# Patient Record
Sex: Female | Born: 1955
Health system: Southern US, Community
[De-identification: ages and names within clinical notes are randomized; demographics above are authoritative.]

## PROBLEM LIST (undated history)

## (undated) DIAGNOSIS — K219 Gastro-esophageal reflux disease without esophagitis: Secondary | ICD-10-CM

## (undated) DIAGNOSIS — Z8719 Personal history of other diseases of the digestive system: Secondary | ICD-10-CM

## (undated) DIAGNOSIS — E7800 Pure hypercholesterolemia, unspecified: Secondary | ICD-10-CM

## (undated) DIAGNOSIS — M1990 Unspecified osteoarthritis, unspecified site: Secondary | ICD-10-CM

## (undated) DIAGNOSIS — I10 Essential (primary) hypertension: Secondary | ICD-10-CM

## (undated) HISTORY — DX: Pure hypercholesterolemia, unspecified: E78.00

## (undated) HISTORY — DX: Gastro-esophageal reflux disease without esophagitis: K21.9

## (undated) HISTORY — PX: CARPAL TUNNEL RELEASE: SHX101

## (undated) HISTORY — PX: TUBAL LIGATION: SHX77

---

## 2013-08-05 NOTE — H&P (Signed)
TOTAL HIP ADMISSION H&P  Patient is admitted for left total hip arthroplasty.  Subjective:  Chief Complaint: Left hip OA / pain  HPI: Sharon Chung, 58 y.o. female, has a history of pain and functional disability in the left hip(s) due to trauma and patient has failed non-surgical conservative treatments for greater than 12 weeks to include NSAID's and/or analgesics, corticosteriod injections and activity modification.  Onset of symptoms was abrupt starting 3+ years ago with gradually worsening course since that time.The patient noted no past surgery on the left hip(s).  Patient currently rates pain in the left hip at 10 out of 10 with activity. Patient has night pain, worsening of pain with activity and weight bearing, trendelenberg gait, pain that interfers with activities of daily living and pain with passive range of motion. Patient has evidence of periarticular osteophytes and joint space narrowing by imaging studies. This condition presents safety issues increasing the risk of falls. There is no current active infection.  Risks, benefits and expectations were discussed with the patient.  Risks including but not limited to the risk of anesthesia, blood clots, nerve damage, blood vessel damage, failure of the prosthesis, infection and up to and including death.  Patient understand the risks, benefits and expectations and wishes to proceed with surgery.   PCP: Toma DeitersHASANAJ,XAJE A, MD  D/C Plans:      Home with HHPT  Post-op Meds:       No Rx given  Tranexamic Acid:      To be given - IV    Decadron:      Is to be given  FYI:     ASA post-op  Norco post-op    Past Medical History  Diagnosis Date  . Arthritis   . Hypertension   . H/O hiatal hernia      Past Surgical History  Procedure Laterality Date  . Tubal ligation     No Known Allergies   History  Substance Use Topics  . Smoking status: Never Smoker   . Smokeless tobacco: Never Used  . Alcohol Use: No       Review of  Systems  Constitutional: Negative.   HENT: Negative.   Eyes: Negative.   Respiratory: Negative.   Cardiovascular: Negative.   Gastrointestinal: Negative.   Genitourinary: Negative.   Musculoskeletal: Positive for back pain, joint pain and myalgias.  Skin: Negative.   Neurological: Negative.   Endo/Heme/Allergies: Negative.   Psychiatric/Behavioral: Negative.     Objective:  Physical Exam  Constitutional: She is oriented to person, place, and time. She appears well-developed and well-nourished.  HENT:  Head: Normocephalic and atraumatic.  Eyes: Pupils are equal, round, and reactive to light.  Neck: Neck supple. No JVD present. No tracheal deviation present. No thyromegaly present.  Cardiovascular: Normal rate, regular rhythm, normal heart sounds and intact distal pulses.   Respiratory: Effort normal and breath sounds normal. No respiratory distress. She has no wheezes.  GI: Soft. There is no tenderness. There is no guarding.  Musculoskeletal:       Left hip: She exhibits decreased range of motion, decreased strength, tenderness and bony tenderness. She exhibits no swelling, no deformity and no laceration.  Lymphadenopathy:    She has no cervical adenopathy.  Neurological: She is alert and oriented to person, place, and time.  Skin: Skin is warm and dry.  Psychiatric: She has a normal mood and affect.      Imaging Review Plain radiographs demonstrate severe degenerative joint disease of the left hip(s).  The bone quality appears to be good for age and reported activity level.  Assessment/Plan:  End stage arthritis, left hip(s)  The patient history, physical examination, clinical judgement of the provider and imaging studies are consistent with end stage degenerative joint disease of the left hip(s) and total hip arthroplasty is deemed medically necessary. The treatment options including medical management, injection therapy, arthroscopy and arthroplasty were discussed at  length. The risks and benefits of total hip arthroplasty were presented and reviewed. The risks due to aseptic loosening, infection, stiffness, dislocation/subluxation,  thromboembolic complications and other imponderables were discussed.  The patient acknowledged the explanation, agreed to proceed with the plan and consent was signed. Patient is being admitted for inpatient treatment for surgery, pain control, PT, OT, prophylactic antibiotics, VTE prophylaxis, progressive ambulation and ADL's and discharge planning.The patient is planning to be discharged home with home health services.    Anastasio Auerbach Suan Pyeatt   PA-C  08/05/2013, 11:23 AM

## 2013-08-06 ENCOUNTER — Encounter (HOSPITAL_COMMUNITY): Payer: Self-pay | Admitting: Pharmacy Technician

## 2013-08-11 ENCOUNTER — Other Ambulatory Visit (HOSPITAL_COMMUNITY): Payer: Self-pay | Admitting: Orthopedic Surgery

## 2013-08-11 NOTE — Patient Instructions (Addendum)
Your procedure is scheduled on:  08/18/13   TUESDAY  [facility]- FOLLOW SIGNS TO SHORT STAY CENTER Short Stay Center at 0600      AM.   Call this number if you have problems the morning of surgery: [phone]        Do not eat food  Or drink :After Midnight .MONDAY NIGHT   Take these medicines the morning of surgery with A SIP OF WATER:AMLODIPINE May take TRAMADOL IF NEEDED   .  Contacts, dentures or partial plates, or metal hairpins  can not be worn to surgery. Your family will be responsible for glasses, dentures, hearing aides while you are in surgery  Leave suitcase in the car. After surgery it may be brought to your room.  For patients admitted to the hospital, checkout time is 11:00 AM day of  discharge.         [facility] IS NOT RESPONSIBLE FOR ANY VALUABLES                                                                                                                                       [facility] - Preparing for Surgery Before surgery, you can play an important role.  Because skin is not sterile, your skin needs to be as free of germs as possible.  You can reduce the number of germs on your skin by washing with CHG (chlorahexidine gluconate) soap before surgery.  CHG is an antiseptic cleaner which kills germs and bonds with the skin to continue killing germs even after washing. Please DO NOT use if you have an allergy to CHG or antibacterial soaps.  If your skin becomes reddened/irritated stop using the CHG and inform your nurse when you arrive at Short Stay. Do not shave (including legs and underarms) for at least 48 hours prior to the first CHG shower.  You may shave your face/neck. Please follow these instructions carefully:  1.  Shower with CHG Soap the night before surgery and the  morning of Surgery.  2.  If you choose to wash your hair, wash your hair first as usual with your  normal  shampoo.  3.  After you shampoo, rinse your  hair and body thoroughly to remove the  shampoo.                           4.  Use CHG as you would any other liquid soap.  You can apply chg directly  to the skin and wash                       Gently with a scrungie or clean washcloth.  5.  Apply the CHG Soap to your body ONLY FROM THE NECK DOWN.   Do not use on face/ open  Wound or open sores. Avoid contact with eyes, ears mouth and genitals (private parts).                       Wash face,  Genitals (private parts) with your normal soap.             6.  Wash thoroughly, paying special attention to the area where your surgery  will be performed.  7.  Thoroughly rinse your body with warm water from the neck down.  8.  DO NOT shower/wash with your normal soap after using and rinsing off  the CHG Soap.                9.  Pat yourself dry with a clean towel.            10.  Wear clean pajamas.            11.  Place clean sheets on your bed the night of your first shower and do not  sleep with pets. Day of Surgery : Do not apply any lotions/deodorants the morning of surgery.  Please wear clean clothes to the hospital/surgery center.  FAILURE TO FOLLOW THESE INSTRUCTIONS MAY RESULT IN THE CANCELLATION OF YOUR SURGERY PATIENT SIGNATURE_________________________________  NURSE SIGNATURE__________________________________  ________________________________________________________________________  WHAT IS A BLOOD TRANSFUSION? Blood Transfusion Information  A transfusion is the replacement of blood or some of its parts. Blood is made up of multiple cells which provide different functions.  Red blood cells carry oxygen and are used for blood loss replacement.  White blood cells fight against infection.  Platelets control bleeding.  Plasma helps clot blood.  Other blood products are available for specialized needs, such as hemophilia or other clotting disorders. BEFORE THE TRANSFUSION  Who gives blood for transfusions?    Healthy volunteers who are fully evaluated to make sure their blood is safe. This is blood bank blood. Transfusion therapy is the safest it has ever been in the practice of medicine. Before blood is taken from a donor, a complete history is taken to make sure that person has no history of diseases nor engages in risky social behavior (examples are intravenous drug use or sexual activity with multiple partners). The donor's travel history is screened to minimize risk of transmitting infections, such as malaria. The donated blood is tested for signs of infectious diseases, such as HIV and hepatitis. The blood is then tested to be sure it is compatible with you in order to minimize the chance of a transfusion reaction. If you or a relative donates blood, this is often done in anticipation of surgery and is not appropriate for emergency situations. It takes many days to process the donated blood. RISKS AND COMPLICATIONS Although transfusion therapy is very safe and saves many lives, the main dangers of transfusion include:   Getting an infectious disease.  Developing a transfusion reaction. This is an allergic reaction to something in the blood you were given. Every precaution is taken to prevent this. The decision to have a blood transfusion has been considered carefully by your caregiver before blood is given. Blood is not given unless the benefits outweigh the risks. AFTER THE TRANSFUSION  Right after receiving a blood transfusion, you will usually feel much better and more energetic. This is especially true if your red blood cells have gotten low (anemic). The transfusion raises the level of the red blood cells which carry oxygen, and this usually causes an energy increase.  The  nurse administering the transfusion will monitor you carefully for complications. HOME CARE INSTRUCTIONS  No special instructions are needed after a transfusion. You may find your energy is better. Speak with your  caregiver about any limitations on activity for underlying diseases you may have. SEEK MEDICAL CARE IF:   Your condition is not improving after your transfusion.  You develop redness or irritation at the intravenous (IV) site. SEEK IMMEDIATE MEDICAL CARE IF:  Any of the following symptoms occur over the next 12 hours:  Shaking chills.  You have a temperature by mouth above 102 F (38.9 C), not controlled by medicine.  Chest, back, or muscle pain.  People around you feel you are not acting correctly or are confused.  Shortness of breath or difficulty breathing.  Dizziness and fainting.  You get a rash or develop hives.  You have a decrease in urine output.  Your urine turns a dark color or changes to pink, red, or brown. Any of the following symptoms occur over the next 10 days:  You have a temperature by mouth above 102 F (38.9 C), not controlled by medicine.  Shortness of breath.  Weakness after normal activity.  The white part of the eye turns yellow (jaundice).  You have a decrease in the amount of urine or are urinating less often.  Your urine turns a dark color or changes to pink, red, or brown. Document Released: 12/23/1999 Document Revised: 03/19/2011 Document Reviewed: 08/11/2007 [facility] Patient Information 2014 [place], [place].  _______________________________________________________________________

## 2013-08-12 ENCOUNTER — Ambulatory Visit (HOSPITAL_COMMUNITY)
Admission: RE | Admit: 2013-08-12 | Discharge: 2013-08-12 | Disposition: A | Discharge status: Home / Self Care | Payer: BC Managed Care – PPO | Source: Ambulatory Visit | Attending: Anesthesiology | Admitting: Anesthesiology

## 2013-08-12 ENCOUNTER — Encounter (HOSPITAL_COMMUNITY)
Admission: RE | Admit: 2013-08-12 | Discharge: 2013-08-12 | Disposition: A | Discharge status: Home / Self Care | Payer: BC Managed Care – PPO | Source: Ambulatory Visit | Attending: Orthopedic Surgery | Admitting: Orthopedic Surgery

## 2013-08-12 ENCOUNTER — Encounter (HOSPITAL_COMMUNITY): Payer: Self-pay

## 2013-08-12 DIAGNOSIS — Z01812 Encounter for preprocedural laboratory examination: Secondary | ICD-10-CM | POA: Insufficient documentation

## 2013-08-12 DIAGNOSIS — I10 Essential (primary) hypertension: Secondary | ICD-10-CM | POA: Diagnosis not present

## 2013-08-12 DIAGNOSIS — Z01818 Encounter for other preprocedural examination: Secondary | ICD-10-CM | POA: Insufficient documentation

## 2013-08-12 DIAGNOSIS — Z01810 Encounter for preprocedural cardiovascular examination: Secondary | ICD-10-CM | POA: Diagnosis not present

## 2013-08-12 HISTORY — DX: Unspecified osteoarthritis, unspecified site: M19.90

## 2013-08-12 HISTORY — DX: Personal history of other diseases of the digestive system: Z87.19

## 2013-08-12 HISTORY — DX: Essential (primary) hypertension: I10

## 2013-08-12 LAB — URINALYSIS, ROUTINE W REFLEX MICROSCOPIC
Bilirubin Urine: NEGATIVE
Glucose, UA: NEGATIVE mg/dL
Hgb urine dipstick: NEGATIVE
Ketones, ur: NEGATIVE mg/dL
Nitrite: NEGATIVE
Protein, ur: NEGATIVE mg/dL
Specific Gravity, Urine: 1.018 (ref 1.005–1.030)
Urobilinogen, UA: 1 mg/dL (ref 0.0–1.0)
pH: 7 (ref 5.0–8.0)

## 2013-08-12 LAB — BASIC METABOLIC PANEL
Anion gap: 13 (ref 5–15)
BUN: 17 mg/dL (ref 6–23)
CO2: 25 mEq/L (ref 19–32)
Calcium: 9.3 mg/dL (ref 8.4–10.5)
Chloride: 102 mEq/L (ref 96–112)
Creatinine, Ser: 0.67 mg/dL (ref 0.50–1.10)
GFR calc Af Amer: >90 mL/min (ref >90)
GFR calc non Af Amer: >90 mL/min (ref >90)
Glucose, Bld: 89 mg/dL (ref 70–99)
Potassium: 4.4 mEq/L (ref 3.7–5.3)
Sodium: 140 mEq/L (ref 137–147)

## 2013-08-12 LAB — APTT: aPTT: 29 seconds (ref 24–37)

## 2013-08-12 LAB — PROTIME-INR
INR: 0.99 (ref 0.00–1.49)
Prothrombin Time: 13.1 seconds (ref 11.6–15.2)

## 2013-08-12 LAB — SURGICAL PCR SCREEN
MRSA, PCR: NEGATIVE
Staphylococcus aureus: POSITIVE — AB

## 2013-08-12 LAB — CBC
HCT: 37.9 % (ref 36.0–46.0)
Hemoglobin: 13.1 g/dL (ref 12.0–15.0)
MCH: 28.9 pg (ref 26.0–34.0)
MCHC: 34.6 g/dL (ref 30.0–36.0)
MCV: 83.5 fL (ref 78.0–100.0)
Platelets: 204 10*3/uL (ref 150–400)
RBC: 4.54 MIL/uL (ref 3.87–5.11)
RDW: 12.6 % (ref 11.5–15.5)
WBC: 4.4 10*3/uL (ref 4.0–10.5)

## 2013-08-12 LAB — URINE MICROSCOPIC-ADD ON

## 2013-08-12 LAB — ABO/RH: ABO/RH(D): A POS

## 2013-08-12 NOTE — Progress Notes (Signed)
08/12/13 0906  OBSTRUCTIVE SLEEP APNEA  Have you ever been diagnosed with sleep apnea through a sleep study? No  Do you snore loudly (loud enough to be heard through closed doors)?  1  Do you often feel tired, fatigued, or sleepy during the daytime? 1  Has anyone observed you stop breathing during your sleep? 0  Do you have, or are you being treated for high blood pressure? 1  BMI more than 35 kg/m2? 1  Age over 60 years old? 1  Neck circumference greater than 40 cm/16 inches? 0  Gender: 0  Obstructive Sleep Apnea Score 5

## 2013-08-12 NOTE — Progress Notes (Signed)
Faxed urine with micro to Dr Olin via EPIC 

## 2013-08-12 NOTE — Progress Notes (Signed)
08/12/13 0906  OBSTRUCTIVE SLEEP APNEA  Have you ever been diagnosed with sleep apnea through a sleep study? No  Do you snore loudly (loud enough to be heard through closed doors)?  1  Has anyone observed you stop breathing during your sleep? 1  Do you have, or are you being treated for high blood pressure? 1  BMI more than 35 kg/m2? 1  Age over 60 years old? 1  Neck circumference greater than 40 cm/16 inches? 0  Gender: 0  Obstructive Sleep Apnea Score 5  Score 4 or greater  Results sent to PCP

## 2013-08-18 ENCOUNTER — Encounter (HOSPITAL_COMMUNITY)
Admission: RE | Disposition: A | Discharge status: Home Health | Payer: Self-pay | Source: Ambulatory Visit | Attending: Orthopedic Surgery

## 2013-08-18 ENCOUNTER — Inpatient Hospital Stay (HOSPITAL_COMMUNITY): Payer: BC Managed Care – PPO

## 2013-08-18 ENCOUNTER — Inpatient Hospital Stay (HOSPITAL_COMMUNITY): Payer: BC Managed Care – PPO | Admitting: Anesthesiology

## 2013-08-18 ENCOUNTER — Inpatient Hospital Stay (HOSPITAL_COMMUNITY)
Admission: RE | Admit: 2013-08-18 | Discharge: 2013-08-19 | DRG: 470 | Disposition: A | Discharge status: Home Health | Payer: BC Managed Care – PPO | Source: Ambulatory Visit | Attending: Orthopedic Surgery | Admitting: Orthopedic Surgery

## 2013-08-18 ENCOUNTER — Encounter (HOSPITAL_COMMUNITY): Payer: Self-pay | Admitting: *Deleted

## 2013-08-18 ENCOUNTER — Encounter (HOSPITAL_COMMUNITY): Payer: BC Managed Care – PPO | Admitting: Anesthesiology

## 2013-08-18 DIAGNOSIS — E669 Obesity, unspecified: Secondary | ICD-10-CM | POA: Diagnosis present

## 2013-08-18 DIAGNOSIS — M1610 Unilateral primary osteoarthritis, unspecified hip: Secondary | ICD-10-CM | POA: Diagnosis present

## 2013-08-18 DIAGNOSIS — I10 Essential (primary) hypertension: Secondary | ICD-10-CM | POA: Diagnosis present

## 2013-08-18 DIAGNOSIS — Z6839 Body mass index (BMI) 39.0-39.9, adult: Secondary | ICD-10-CM | POA: Diagnosis not present

## 2013-08-18 DIAGNOSIS — K449 Diaphragmatic hernia without obstruction or gangrene: Secondary | ICD-10-CM | POA: Diagnosis present

## 2013-08-18 DIAGNOSIS — Z96642 Presence of left artificial hip joint: Secondary | ICD-10-CM

## 2013-08-18 DIAGNOSIS — D62 Acute posthemorrhagic anemia: Secondary | ICD-10-CM | POA: Diagnosis not present

## 2013-08-18 DIAGNOSIS — M169 Osteoarthritis of hip, unspecified: Principal | ICD-10-CM | POA: Diagnosis present

## 2013-08-18 DIAGNOSIS — M25559 Pain in unspecified hip: Secondary | ICD-10-CM | POA: Diagnosis present

## 2013-08-18 HISTORY — PX: TOTAL HIP ARTHROPLASTY: SHX124

## 2013-08-18 LAB — TYPE AND SCREEN
ABO/RH(D): A POS
Antibody Screen: NEGATIVE

## 2013-08-18 SURGERY — ARTHROPLASTY, HIP, TOTAL, ANTERIOR APPROACH
Anesthesia: Spinal | Site: Hip | Laterality: Left

## 2013-08-18 MED ORDER — METHOCARBAMOL 500 MG PO TABS
500.0000 mg | ORAL_TABLET | Freq: Four times a day (QID) | ORAL | Status: DC | PRN
  Administered 2013-08-18 – 2013-08-19 (×2): 500 mg via ORAL
  Filled 2013-08-18 (×2): qty 1

## 2013-08-18 MED ORDER — ACETAMINOPHEN 500 MG PO TABS
1000.0000 mg | ORAL_TABLET | Freq: Four times a day (QID) | ORAL | Status: AC
  Administered 2013-08-18 (×2): 1000 mg via ORAL
  Filled 2013-08-18 (×2): qty 2

## 2013-08-18 MED ORDER — SODIUM CHLORIDE 0.9 % IV SOLN
1000.0000 mg | Freq: Once | INTRAVENOUS | Status: AC
  Administered 2013-08-18: 1000 mg via INTRAVENOUS
  Filled 2013-08-18: qty 10

## 2013-08-18 MED ORDER — DEXAMETHASONE SODIUM PHOSPHATE 10 MG/ML IJ SOLN
10.0000 mg | Freq: Once | INTRAMUSCULAR | Status: AC
  Administered 2013-08-18: 10 mg via INTRAVENOUS

## 2013-08-18 MED ORDER — AMLODIPINE BESYLATE 5 MG PO TABS
5.0000 mg | ORAL_TABLET | Freq: Every morning | ORAL | Status: DC
  Administered 2013-08-19: 5 mg via ORAL
  Filled 2013-08-18: qty 1

## 2013-08-18 MED ORDER — PROPOFOL 10 MG/ML IV BOLUS: INTRAVENOUS | Status: DC | PRN

## 2013-08-18 MED ORDER — CEFAZOLIN SODIUM-DEXTROSE 2-3 GM-% IV SOLR
INTRAVENOUS | Status: AC
  Filled 2013-08-18: qty 50

## 2013-08-18 MED ORDER — MIDAZOLAM HCL 2 MG/2ML IJ SOLN
INTRAMUSCULAR | Status: AC
  Filled 2013-08-18: qty 2

## 2013-08-18 MED ORDER — PHENYLEPHRINE HCL 10 MG/ML IJ SOLN
INTRAMUSCULAR | Status: DC | PRN
  Administered 2013-08-18 (×4): 40 ug via INTRAVENOUS
  Administered 2013-08-18: 80 ug via INTRAVENOUS
  Administered 2013-08-18: 40 ug via INTRAVENOUS

## 2013-08-18 MED ORDER — MIDAZOLAM HCL 5 MG/5ML IJ SOLN
INTRAMUSCULAR | Status: DC | PRN
  Administered 2013-08-18: 2 mg via INTRAVENOUS

## 2013-08-18 MED ORDER — HYDROMORPHONE HCL PF 1 MG/ML IJ SOLN: 0.2500 mg | INTRAMUSCULAR | Status: DC | PRN

## 2013-08-18 MED ORDER — CEFAZOLIN SODIUM-DEXTROSE 2-3 GM-% IV SOLR
2.0000 g | INTRAVENOUS | Status: AC
  Administered 2013-08-18: 2 g via INTRAVENOUS

## 2013-08-18 MED ORDER — MAGNESIUM CITRATE PO SOLN: 1.0000 | Freq: Once | ORAL | Status: AC | PRN

## 2013-08-18 MED ORDER — 0.9 % SODIUM CHLORIDE (POUR BTL) OPTIME
TOPICAL | Status: DC | PRN
  Administered 2013-08-18: 1000 mL

## 2013-08-18 MED ORDER — METOCLOPRAMIDE HCL 5 MG PO TABS
5.0000 mg | ORAL_TABLET | Freq: Three times a day (TID) | ORAL | Status: DC | PRN
Start: 2013-08-18 — End: 2013-08-19
  Filled 2013-08-18: qty 2

## 2013-08-18 MED ORDER — PROPOFOL 10 MG/ML IV BOLUS
INTRAVENOUS | Status: AC
  Filled 2013-08-18: qty 20

## 2013-08-18 MED ORDER — BISACODYL 10 MG RE SUPP: 10.0000 mg | Freq: Every day | RECTAL | Status: DC | PRN

## 2013-08-18 MED ORDER — DIPHENHYDRAMINE HCL 25 MG PO CAPS: 25.0000 mg | ORAL_CAPSULE | Freq: Four times a day (QID) | ORAL | Status: DC | PRN

## 2013-08-18 MED ORDER — ZOLPIDEM TARTRATE 5 MG PO TABS: 5.0000 mg | ORAL_TABLET | Freq: Every evening | ORAL | Status: DC | PRN

## 2013-08-18 MED ORDER — BISOPROLOL FUMARATE 5 MG PO TABS
5.0000 mg | ORAL_TABLET | Freq: Every day | ORAL | Status: DC
  Administered 2013-08-18: 5 mg via ORAL
  Filled 2013-08-18 (×2): qty 1

## 2013-08-18 MED ORDER — DEXAMETHASONE SODIUM PHOSPHATE 10 MG/ML IJ SOLN
10.0000 mg | Freq: Once | INTRAMUSCULAR | Status: AC
  Administered 2013-08-19: 10 mg via INTRAVENOUS
  Filled 2013-08-18: qty 1

## 2013-08-18 MED ORDER — METHOCARBAMOL 1000 MG/10ML IJ SOLN
500.0000 mg | Freq: Four times a day (QID) | INTRAVENOUS | Status: DC | PRN
  Administered 2013-08-18 (×2): 500 mg via INTRAVENOUS
  Filled 2013-08-18 (×2): qty 5

## 2013-08-18 MED ORDER — PROMETHAZINE HCL 25 MG/ML IJ SOLN: 6.2500 mg | INTRAMUSCULAR | Status: DC | PRN

## 2013-08-18 MED ORDER — HYDROCHLOROTHIAZIDE 10 MG/ML ORAL SUSPENSION
6.2500 mg | Freq: Every day | ORAL | Status: DC
  Administered 2013-08-18: 6.25 mg via ORAL
  Filled 2013-08-18 (×2): qty 1.25

## 2013-08-18 MED ORDER — ONDANSETRON HCL 4 MG PO TABS
4.0000 mg | ORAL_TABLET | Freq: Four times a day (QID) | ORAL | Status: DC | PRN
Start: 2013-08-18 — End: 2013-08-19

## 2013-08-18 MED ORDER — ONDANSETRON HCL 4 MG/2ML IJ SOLN
INTRAMUSCULAR | Status: AC
  Filled 2013-08-18: qty 2

## 2013-08-18 MED ORDER — CEFAZOLIN SODIUM-DEXTROSE 2-3 GM-% IV SOLR
2.0000 g | Freq: Four times a day (QID) | INTRAVENOUS | Status: AC
  Administered 2013-08-18 (×2): 2 g via INTRAVENOUS
  Filled 2013-08-18 (×2): qty 50

## 2013-08-18 MED ORDER — HYDROCODONE-ACETAMINOPHEN 7.5-325 MG PO TABS
1.0000 | ORAL_TABLET | ORAL | Status: DC | PRN
  Administered 2013-08-18 – 2013-08-19 (×3): 2 via ORAL
  Filled 2013-08-18 (×3): qty 2

## 2013-08-18 MED ORDER — BUPIVACAINE HCL (PF) 0.5 % IJ SOLN
INTRAMUSCULAR | Status: AC
  Filled 2013-08-18: qty 30

## 2013-08-18 MED ORDER — FERROUS SULFATE 325 (65 FE) MG PO TABS
325.0000 mg | ORAL_TABLET | Freq: Three times a day (TID) | ORAL | Status: DC
  Administered 2013-08-18 – 2013-08-19 (×2): 325 mg via ORAL
  Filled 2013-08-18 (×5): qty 1

## 2013-08-18 MED ORDER — METOCLOPRAMIDE HCL 5 MG/ML IJ SOLN: 5.0000 mg | Freq: Three times a day (TID) | INTRAMUSCULAR | Status: DC | PRN

## 2013-08-18 MED ORDER — ONDANSETRON HCL 4 MG/2ML IJ SOLN: 4.0000 mg | Freq: Four times a day (QID) | INTRAMUSCULAR | Status: DC | PRN

## 2013-08-18 MED ORDER — DEXAMETHASONE SODIUM PHOSPHATE 10 MG/ML IJ SOLN
INTRAMUSCULAR | Status: AC
  Filled 2013-08-18: qty 1

## 2013-08-18 MED ORDER — HYDROMORPHONE HCL PF 1 MG/ML IJ SOLN
0.5000 mg | INTRAMUSCULAR | Status: DC | PRN
  Administered 2013-08-18: 0.5 mg via INTRAVENOUS
  Administered 2013-08-18 – 2013-08-19 (×3): 1 mg via INTRAVENOUS
  Filled 2013-08-18 (×4): qty 1

## 2013-08-18 MED ORDER — ONDANSETRON HCL 4 MG/2ML IJ SOLN
INTRAMUSCULAR | Status: DC | PRN
  Administered 2013-08-18: 4 mg via INTRAVENOUS

## 2013-08-18 MED ORDER — BUPIVACAINE HCL (PF) 0.5 % IJ SOLN
INTRAMUSCULAR | Status: DC | PRN
  Administered 2013-08-18: 3 mL

## 2013-08-18 MED ORDER — DOCUSATE SODIUM 100 MG PO CAPS
100.0000 mg | ORAL_CAPSULE | Freq: Two times a day (BID) | ORAL | Status: DC
  Administered 2013-08-18 – 2013-08-19 (×2): 100 mg via ORAL

## 2013-08-18 MED ORDER — LIDOCAINE HCL (CARDIAC) 20 MG/ML IV SOLN
INTRAVENOUS | Status: AC
  Filled 2013-08-18: qty 5

## 2013-08-18 MED ORDER — PROPOFOL INFUSION 10 MG/ML OPTIME
INTRAVENOUS | Status: DC | PRN
  Administered 2013-08-18: 100 ug/kg/min via INTRAVENOUS

## 2013-08-18 MED ORDER — PHENOL 1.4 % MT LIQD: 1.0000 | OROMUCOSAL | Status: DC | PRN

## 2013-08-18 MED ORDER — FENTANYL CITRATE 0.05 MG/ML IJ SOLN
INTRAMUSCULAR | Status: AC
  Filled 2013-08-18: qty 2

## 2013-08-18 MED ORDER — LACTATED RINGERS IV SOLN
INTRAVENOUS | Status: DC | PRN
  Administered 2013-08-18 (×2): via INTRAVENOUS

## 2013-08-18 MED ORDER — CELECOXIB 200 MG PO CAPS
200.0000 mg | ORAL_CAPSULE | Freq: Two times a day (BID) | ORAL | Status: DC
  Administered 2013-08-18 – 2013-08-19 (×3): 200 mg via ORAL
  Filled 2013-08-18 (×4): qty 1

## 2013-08-18 MED ORDER — PHENYLEPHRINE 40 MCG/ML (10ML) SYRINGE FOR IV PUSH (FOR BLOOD PRESSURE SUPPORT)
PREFILLED_SYRINGE | INTRAVENOUS | Status: AC
  Filled 2013-08-18: qty 10

## 2013-08-18 MED ORDER — LACTATED RINGERS IV SOLN: INTRAVENOUS | Status: DC

## 2013-08-18 MED ORDER — ASPIRIN EC 325 MG PO TBEC
325.0000 mg | DELAYED_RELEASE_TABLET | Freq: Every day | ORAL | Status: DC
  Administered 2013-08-19: 325 mg via ORAL
  Filled 2013-08-18 (×2): qty 1

## 2013-08-18 MED ORDER — LIDOCAINE HCL (CARDIAC) 20 MG/ML IV SOLN
INTRAVENOUS | Status: DC | PRN
  Administered 2013-08-18: 50 mg via INTRAVENOUS

## 2013-08-18 MED ORDER — POLYETHYLENE GLYCOL 3350 17 G PO PACK
17.0000 g | PACK | Freq: Every day | ORAL | Status: DC | PRN
Start: 2013-08-18 — End: 2013-08-19

## 2013-08-18 MED ORDER — ALUM & MAG HYDROXIDE-SIMETH 200-200-20 MG/5ML PO SUSP: 30.0000 mL | ORAL | Status: DC | PRN

## 2013-08-18 MED ORDER — HYDROCODONE-ACETAMINOPHEN 7.5-325 MG PO TABS
1.0000 | ORAL_TABLET | Freq: Four times a day (QID) | ORAL | Status: DC
  Administered 2013-08-18 (×2): 1 via ORAL
  Filled 2013-08-18 (×2): qty 1

## 2013-08-18 MED ORDER — SODIUM CHLORIDE 0.9 % IV SOLN
INTRAVENOUS | Status: DC
  Administered 2013-08-18 – 2013-08-19 (×2): via INTRAVENOUS
  Filled 2013-08-18 (×4): qty 1000

## 2013-08-18 MED ORDER — MENTHOL 3 MG MT LOZG: 1.0000 | LOZENGE | OROMUCOSAL | Status: DC | PRN

## 2013-08-18 MED ORDER — BISOPROLOL-HYDROCHLOROTHIAZIDE 5-6.25 MG PO TABS: 1.0000 | ORAL_TABLET | Freq: Every evening | ORAL | Status: DC

## 2013-08-18 MED ORDER — FENTANYL CITRATE 0.05 MG/ML IJ SOLN
INTRAMUSCULAR | Status: DC | PRN
  Administered 2013-08-18: 100 ug via INTRAVENOUS

## 2013-08-18 SURGICAL SUPPLY — 41 items
BAG ZIPLOCK 12X15 (MISCELLANEOUS) IMPLANT
CAPT HIP PF COP ×2 IMPLANT
COVER PERINEAL POST (MISCELLANEOUS) ×2 IMPLANT
DERMABOND ADVANCED (GAUZE/BANDAGES/DRESSINGS) ×1
DERMABOND ADVANCED .7 DNX12 (GAUZE/BANDAGES/DRESSINGS) ×1 IMPLANT
DRAPE C-ARM 42X120 X-RAY (DRAPES) ×2 IMPLANT
DRAPE STERI IOBAN 125X83 (DRAPES) ×2 IMPLANT
DRAPE U-SHAPE 47X51 STRL (DRAPES) ×6 IMPLANT
DRSG AQUACEL AG ADV 3.5X10 (GAUZE/BANDAGES/DRESSINGS) ×2 IMPLANT
DURAPREP 26ML APPLICATOR (WOUND CARE) ×2 IMPLANT
ELECT BLADE TIP CTD 4 INCH (ELECTRODE) ×2 IMPLANT
ELECT REM PT RETURN 9FT ADLT (ELECTROSURGICAL) ×2
ELECTRODE REM PT RTRN 9FT ADLT (ELECTROSURGICAL) ×1 IMPLANT
FACESHIELD WRAPAROUND (MASK) ×8 IMPLANT
GLOVE BIO SURGEON STRL SZ7 (GLOVE) ×2 IMPLANT
GLOVE BIO SURGEON STRL SZ8 (GLOVE) ×2 IMPLANT
GLOVE BIOGEL M 7.0 STRL (GLOVE) ×2 IMPLANT
GLOVE BIOGEL PI IND STRL 6.5 (GLOVE) ×1 IMPLANT
GLOVE BIOGEL PI IND STRL 7.0 (GLOVE) ×1 IMPLANT
GLOVE BIOGEL PI IND STRL 7.5 (GLOVE) ×2 IMPLANT
GLOVE BIOGEL PI IND STRL 8.5 (GLOVE) ×1 IMPLANT
GLOVE BIOGEL PI INDICATOR 6.5 (GLOVE) ×1
GLOVE BIOGEL PI INDICATOR 7.0 (GLOVE) ×1
GLOVE BIOGEL PI INDICATOR 7.5 (GLOVE) ×2
GLOVE BIOGEL PI INDICATOR 8.5 (GLOVE) ×1
GLOVE ORTHO TXT STRL SZ7.5 (GLOVE) ×4 IMPLANT
GOWN STRL REUS W/TWL LRG LVL3 (GOWN DISPOSABLE) ×2 IMPLANT
GOWN STRL REUS W/TWL XL LVL3 (GOWN DISPOSABLE) ×6 IMPLANT
HOLDER FOLEY CATH W/STRAP (MISCELLANEOUS) ×2 IMPLANT
KIT BASIN OR (CUSTOM PROCEDURE TRAY) ×2 IMPLANT
PACK TOTAL JOINT (CUSTOM PROCEDURE TRAY) ×2 IMPLANT
SAW OSC TIP CART 19.5X105X1.3 (SAW) ×2 IMPLANT
SUT MNCRL AB 4-0 PS2 18 (SUTURE) ×2 IMPLANT
SUT VIC AB 1 CT1 36 (SUTURE) ×6 IMPLANT
SUT VIC AB 2-0 CT1 27 (SUTURE) ×2
SUT VIC AB 2-0 CT1 TAPERPNT 27 (SUTURE) ×2 IMPLANT
SUT VLOC 180 0 24IN GS25 (SUTURE) ×2 IMPLANT
TOWEL OR 17X26 10 PK STRL BLUE (TOWEL DISPOSABLE) ×2 IMPLANT
TOWEL OR NON WOVEN STRL DISP B (DISPOSABLE) ×2 IMPLANT
TRAY FOLEY CATH 14FRSI W/METER (CATHETERS) ×2 IMPLANT
WATER STERILE IRR 1500ML POUR (IV SOLUTION) ×2 IMPLANT

## 2013-08-18 NOTE — Interval H&P Note (Signed)
History and Physical Interval Note:  08/18/2013 7:02 AM  Sharon SalinaHilda Chung  has presented today for surgery, with the diagnosis of left hip osteoarthritis  The various methods of treatment have been discussed with the patient and family. After consideration of risks, benefits and other options for treatment, the patient has consented to  Procedure(s): LEFT TOTAL HIP ARTHROPLASTY ANTERIOR APPROACH (Left) as a surgical intervention .  The patient's history has been reviewed, patient examined, no change in status, stable for surgery.  I have reviewed the patient's chart and labs.  Questions were answered to the patient's satisfaction.     [name],[name]

## 2013-08-18 NOTE — Op Note (Signed)
NAME:  Sharon Chung                ACCOUNT NO.: [id number]      MEDICAL RECORD NO.: [id number]      [facility]ACILITY[facility]      PHYSICIAN:  Kyleah Pensabene D  DATE OF BIRTH:  07/23/1955     DATE OF PROCEDURE:  08/18/2013                                 OPERATIVE REPORT         PREOPERATIVE DIAGNOSIS: Left  hip osteoarthritis.      POSTOPERATIVE DIAGNOSIS:  Left hip osteoarthritis.      PROCEDURE:  Left total hip replacement through an anterior approacGreene Cou[address] 330[facility] HosViv252[facility] WesViviann Spa55myBValley KeN4The UniMarian Regional [facility] Cen432MemoriAdvanced Pain S(91432Nd StrTristar Skyl340Newton-WellesleyCaril860[place] ReViviann Spa42mySharp M80(443H[facility] SpecGeorge [facility]. [facility] Affa(534[facility] CVivGateway Rehabilitation Ho406[facility] CVivSelect Specialty Hospital [facility] Penn336RockforAurelia [facility] [place] R304ShepeHealthalliance Hospital - Ma6[facility] SViviann Spa58myBBiKeNThe Ou305[facility] CBerkeley En(636)[facility] HosViviann [place] 934SelDigestive Diseases Center (623Specialty Surgical CenteBay Ar(203CenterThe OrthAdvocate Good (667)[facility] HosCoast Plaz(858)[place]-TownRecovery719St. LuSou(606[facility] M D UppeHowerton S540[place] Cataract And LaseViviann SpOakland Physi510Queens Healtheast904Ar[place] SurgiSt. Jam731Surgical Eye Experts LLC Dba Surgical Expert Of New EnglanViviann SpaMyrlene Brokerpitalpa70myBPleasant KeN3Mclean Hospital CorporationeVe62rmoEastern La Men ceramic   ball.      SURGEON:  Dietra Stokely D. Sayid Moll, M.D.      ASSISTANT:  Blair Roberts, PA-C      ANESTHESIA:  Spinal.      SPECIMENS:  None.      COMPLICATIONS:  None.      BLOOD LOSS:  500 cc     DRAINS:  None.      INDICATION OF THE PROCEDURE:  Sharon Chung is a 58 y.o. female who had   presented to office for evaluation of left hip pain.  Radiographs revealed   progressive degenerative changes with bone-on-bone   articulation to the  hip joint.  The patient had painful limited range of   motion significantly affecting their overall quality of life.  The patient was failing to    respond to conservative measures, and at this point was ready   to proceed with more definitive measures.  The patient has noted progressive   degenerative changes in his hip, progressive problems and dysfunction   with regarding the hip prior to surgery.  Consent was obtained for   benefit of pain relief.  Specific risk of infection, DVT, component   failure, dislocation, need for revision surgery, as well discussion of   the anterior versus posterior approach were reviewed.  Consent was   obtained for benefit of anterior pain relief through an anterior   approach.      PROCEDURE IN DETAIL:  The patient was brought to operative theater.   Once adequate anesthesia, preoperative  antibiotics, 2gm of Ancef administered.   The patient was positioned supine on the OSI Hanna table.  Once adequate   padding of boney process was carried out, we had predraped out the hip, and  used fluoroscopy to confirm orientation of the pelvis and position.      The left hip was then prepped and draped from proximal iliac crest to   mid thigh with shower curtain technique.      Time-out was performed identifying the patient, planned procedure, and   extremity.     An incision was then made 2 cm distal and lateral to the   anterior superior iliac spine extending over the orientation of the   tensor fascia lata muscle and sharp dissection was carried down to the   fascia of the muscle and protractor placed in the soft tissues.      The fascia was then incised.  The muscle belly was identified and swept  laterally and retractor placed along the superior neck.  Following   cauterization of the circumflex vessels and removing some pericapsular   fat, a second cobra retractor was placed on the inferior neck.  A third   retractor was placed on the anterior acetabulum after elevating the   anterior rectus.  A L-capsulotomy was along the line of the   superior neck to the trochanteric fossa, then extended proximally and   distally.  Tag sutures were placed and the retractors were then placed   intracapsular.  We then identified the trochanteric fossa and   orientation of my neck cut, confirmed this radiographically   and then made a neck osteotomy with the femur on traction.  The femoral   head was removed without difficulty or complication.  Traction was let   off and retractors were placed posterior and anterior around the   acetabulum.      The labrum and foveal tissue were debrided.  I began reaming with a 47mm   reamer and reamed up to 51mm reamer with good bony bed preparation and a 52mm   cup was chosen.  The final 52mm Pinnacle cup was then impacted under fluoroscopy  to confirm  the depth of penetration and orientation with respect to   abduction.  A screw was placed followed by the hole eliminator.  The final   36+4 neutral Altrex liner was impacted with good visualized rim fit.  The cup was positioned anatomically within the acetabular portion of the pelvis.      At this point, the femur was rolled at 80 degrees.  Further capsule was   released off the inferior aspect of the femoral neck.  I then   released the superior capsule proximally.  The hook was placed laterally   along the femur and elevated manually and held in position with the bed   hook.  The leg was then extended and adducted with the leg rolled to 100   degrees of external rotation.  Once the proximal femur was fully   exposed, I used a box osteotome to set orientation.  I then began   broaching with the starting chili pepper broach and passed this by hand and then broached up to 1.  With the 1 broach in place I chose a high offset neck and did a trial reduction.  The offset was appropriate, leg lengths   appeared to be equal, confirmed radiographically.   Given these findings, I went ahead and dislocated the hip, repositioned all   retractors and positioned the right hip in the extended and abducted position.  The final 1 Hi Tri Lock stem was   chosen and it was impacted down to the level of neck cut.  Based on this   and the trial reduction, a 36+1.5 delta ceramic ball was chosen and   impacted onto a clean and dry trunnion, and the hip was reduced.  The   hip had been irrigated throughout the case again at this point.  I did   reapproximate the superior capsular leaflet to the anterior leaflet   using #1 Vicryl.  The fascia of the   tensor fascia lata muscle was then reapproximated using #1 Vicryl and #0 V-lock sutures.  The   remaining wound was closed with 2-0 Vicryl and running 4-0 Monocryl.   The hip was cleaned, dried, and dressed sterilely using Dermabond and   Aquacel dressing.  She was  then brought   to recovery room  in stable condition tolerating the procedure well.    Skip MayerBlair Roberts, PA-C was present for the entirety of the case involved from   preoperative positioning, perioperative retractor management, general   facilitation of the case, as well as primary wound closure as assistant.            Madlyn FrankelMatthew D. Charlann Boxerlin, M.D.        08/18/2013 10:31 AM

## 2013-08-18 NOTE — Anesthesia Procedure Notes (Signed)
Spinal  Patient location during procedure: OR End time: 08/18/2013 8:55 AM Staffing CRNA/Resident: Noralyn Pick Performed by: anesthesiologist and resident/CRNA  Preanesthetic Checklist Completed: patient identified, site marked, surgical consent, pre-op evaluation, timeout performed, IV checked, risks and benefits discussed and monitors and equipment checked Spinal Block Patient position: sitting Prep: Betadine Patient monitoring: heart rate, continuous pulse ox and blood pressure Approach: midline Location: L2-3 Injection technique: single-shot Needle Needle type: Sprotte and Tuohy  Needle gauge: 24 G Needle length: 9 cm Assessment Sensory level: T6 Additional Notes Expiration date of kit checked and confirmed. Patient tolerated procedure well, without complications.

## 2013-08-18 NOTE — Progress Notes (Signed)
PT Cancellation Note  Patient Details Name: Sharon Chung MRN: [id number] DOB: 06/20/1955   Cancelled Treatment:    Reason Eval/Treat Not Completed:  (numbness persisting)   Sharon HayHill, Sharon Chung 08/18/2013, 5:21 PM Sharon KelchKaren Logan Vegh PT [phone]

## 2013-08-18 NOTE — Transfer of Care (Signed)
Immediate Anesthesia Transfer of Care Note  Patient: Sharon SalinaHilda Chung  Procedure(s) Performed: Procedure(s): LEFT TOTAL HIP ARTHROPLASTY ANTERIOR APPROACH (Left)  Patient Location: PACU  Anesthesia Type:Spinal  Level of Consciousness: awake and patient cooperative  Airway & Oxygen Therapy: Patient Spontanous Breathing and Patient connected to face mask oxygen  Post-op Assessment: Report given to PACU RN and Post -op Vital signs reviewed and stable  Post vital signs: Reviewed and stable  Complications: No apparent anesthesia complications

## 2013-08-18 NOTE — Anesthesia Postprocedure Evaluation (Signed)
  Anesthesia Post-op Note  Patient: Servando SalinaHilda Baumgardner  Procedure(s) Performed: Procedure(s): LEFT TOTAL HIP ARTHROPLASTY ANTERIOR APPROACH (Left)  Patient Location: PACU  Anesthesia Type:Spinal  Level of Consciousness: awake, alert  and oriented  Airway and Oxygen Therapy: Patient Spontanous Breathing  Post-op Pain: none  Post-op Assessment: Post-op Vital signs reviewed. Spinal resolving.  Post-op Vital Signs: Reviewed  Last Vitals:  Filed Vitals:   08/18/13 1305  BP: 116/67  Pulse: 60  Temp: 36.7 C  Resp: 16    Complications: No apparent anesthesia complications

## 2013-08-18 NOTE — Anesthesia Preprocedure Evaluation (Addendum)
Anesthesia Evaluation  Patient identified by MRN, date of birth, ID band Patient awake    Reviewed: Allergy & Precautions, H&P , NPO status , Patient's Chart, lab work & pertinent test results  Airway Mallampati: II TM Distance: >3 FB Neck ROM: Full    Dental  (+) Edentulous Upper, Dental Advisory Given   Pulmonary neg pulmonary ROS,  breath sounds clear to auscultation        Cardiovascular hypertension, Rhythm:Regular Rate:Normal     Neuro/Psych negative neurological ROS     GI/Hepatic Neg liver ROS, hiatal hernia,   Endo/Other  Morbid obesity  Renal/GU negative Renal ROS  negative genitourinary   Musculoskeletal negative musculoskeletal ROS (+)   Abdominal   Peds  Hematology negative hematology ROS (+)   Anesthesia Other Findings   Reproductive/Obstetrics negative OB ROS                          Anesthesia Physical Anesthesia Plan  ASA: II  Anesthesia Plan: Spinal   Post-op Pain Management:    Induction: Intravenous  Airway Management Planned: Simple Face Mask  Additional Equipment: None  Intra-op Plan:   Post-operative Plan:   Informed Consent: I have reviewed the patients History and Physical, chart, labs and discussed the procedure including the risks, benefits and alternatives for the proposed anesthesia with the patient or authorized representative who has indicated his/her understanding and acceptance.   Dental advisory given  Plan Discussed with: CRNA, Anesthesiologist and Surgeon  Anesthesia Plan Comments:        Anesthesia Quick Evaluation

## 2013-08-19 DIAGNOSIS — D62 Acute posthemorrhagic anemia: Secondary | ICD-10-CM | POA: Diagnosis not present

## 2013-08-19 DIAGNOSIS — E669 Obesity, unspecified: Secondary | ICD-10-CM | POA: Diagnosis present

## 2013-08-19 LAB — BASIC METABOLIC PANEL
Anion gap: 11 (ref 5–15)
BUN: 11 mg/dL (ref 6–23)
CO2: 23 mEq/L (ref 19–32)
Calcium: 8.5 mg/dL (ref 8.4–10.5)
Chloride: 105 mEq/L (ref 96–112)
Creatinine, Ser: 0.59 mg/dL (ref 0.50–1.10)
GFR calc Af Amer: >90 mL/min (ref >90)
GFR calc non Af Amer: >90 mL/min (ref >90)
Glucose, Bld: 143 mg/dL — ABNORMAL HIGH (ref 70–99)
Potassium: 3.8 mEq/L (ref 3.7–5.3)
Sodium: 139 mEq/L (ref 137–147)

## 2013-08-19 LAB — CBC
HCT: 29.8 % — ABNORMAL LOW (ref 36.0–46.0)
Hemoglobin: 10.7 g/dL — ABNORMAL LOW (ref 12.0–15.0)
MCH: 29 pg (ref 26.0–34.0)
MCHC: 35.9 g/dL (ref 30.0–36.0)
MCV: 80.8 fL (ref 78.0–100.0)
Platelets: 184 10*3/uL (ref 150–400)
RBC: 3.69 MIL/uL — ABNORMAL LOW (ref 3.87–5.11)
RDW: 12.4 % (ref 11.5–15.5)
WBC: 9 10*3/uL (ref 4.0–10.5)

## 2013-08-19 MED ORDER — ASPIRIN 325 MG PO TBEC: 325.0000 mg | DELAYED_RELEASE_TABLET | Freq: Every day | ORAL | Status: AC

## 2013-08-19 MED ORDER — DSS 100 MG PO CAPS: 100.0000 mg | ORAL_CAPSULE | Freq: Two times a day (BID) | ORAL | Status: DC

## 2013-08-19 MED ORDER — HYDROCODONE-ACETAMINOPHEN 7.5-325 MG PO TABS: 1.0000 | ORAL_TABLET | ORAL | Status: DC | PRN

## 2013-08-19 MED ORDER — FERROUS SULFATE 325 (65 FE) MG PO TABS: 325.0000 mg | ORAL_TABLET | Freq: Three times a day (TID) | ORAL | Status: DC

## 2013-08-19 MED ORDER — METHOCARBAMOL 500 MG PO TABS: 500.0000 mg | ORAL_TABLET | Freq: Four times a day (QID) | ORAL | Status: DC | PRN

## 2013-08-19 MED ORDER — POLYETHYLENE GLYCOL 3350 17 G PO PACK: 17.0000 g | PACK | Freq: Two times a day (BID) | ORAL | Status: DC

## 2013-08-19 NOTE — Progress Notes (Signed)
CARE MANAGEMENT NOTE 08/19/2013  Patient:  Servando SalinaGOINS,Arriyanna   Account Number:  [id number]  Date Initiated:  08/19/2013  Documentation initiated by:  Linden Mikes  Subjective/Objective Assessment:   left ant.hip     Action/Plan:   [facility]  has needed dme in the home.   Anticipated DC Date:  08/19/2013   Anticipated DC Plan:  HOME W HOME HEALTH SERVICES  In-house referral  NA      DC Planning Services  CM consult      [facility]  NA   Choice offered to / List presented to:  C-1 Patient   DME arranged  NA      DME agency  NA     HH arranged  HH-2 PT      [facility]  [facility]   Status of service:  Completed, signed off Medicare Important Message given?  NA - LOS <3 / Initial given by admissions (If response is "NO", the following Medicare IM given date fields will be blank) Date Medicare IM given:   Medicare IM given by:   Date Additional Medicare IM given:   Additional Medicare IM given by:    Discharge Disposition:  HOME W HOME HEALTH SERVICES  Per UR Regulation:  Reviewed for med. necessity/level of care/duration of stay  If discussed at Long Length of Stay Meetings, dates discussed:    Comments:  [id number]/Kamaree Berkel Lorrin MaisDavis, RN,BSN,CCM: [phone] Chart reviewed for patient status and needs. No discharge needs present at time of review.

## 2013-08-19 NOTE — Evaluation (Signed)
Occupational Therapy Evaluation Patient Details Name: Sharon Chung MRN: [id number] DOB: [birth year] Today's Date: 08/19/2013    History of Present Illness Sharon Chung   Clinical Impression   This 58 year old female was admitted for the above surgery.  All education was completed. Pt needs reinforcement with safety with RW:  Reviewed with both her and her sister.  She will have 24/7 at home.  No further OT is needed at this time.      Follow Up Recommendations  No OT follow up;Supervision/Assistance - 24 hour    Equipment Recommendations  None recommended by OT    Recommendations for Other Services       Precautions / Restrictions Precautions Precautions: Fall Restrictions Weight Bearing Restrictions: No      Mobility Bed Mobility                  Transfers Overall transfer level: Needs assistance Equipment used: Rolling walker (2 wheeled) Transfers: Sit to/from Stand Sit to Stand: Supervision         General transfer comment: cues for safety    Balance                                            ADL Overall ADL's : Needs assistance/impaired     Grooming: Wash/dry hands;Supervision/safety;Standing       Lower Body Bathing: With adaptive equipment;Sit to/from stand;Supervison/ safety       Lower Body Dressing: Minimal assistance;Sit to/from stand;With adaptive equipment   Toilet Transfer: Min guard;Ambulation;BSC             General ADL Comments: Pt has reacher and long back scratcher which she uses for adls.  She has borrowed DME and verbalizes comfort with it.  Safety cues given throughout session:  pt attempted to stand without walker, turned to throw toilet paper away without moving walker and attempted to step over to sink without walker.  Reinforced safety with pt and sister.  They had questions about when she can drive and how to get into vehicle.  Enforced that Dr Charlann Boxer has to release her to drive.  Also she uses a stool as  vehicle is high, and reinforced safety.       Vision                     Perception     Praxis      Pertinent Vitals/Pain Pain Assessment: 0-10 Pain Score: 6  (initially when standing then 2) Pain Location: Sharon thigh Pain Descriptors / Indicators: Aching Pain Intervention(s): Premedicated before session;Repositioned (pt declined ice)     Hand Dominance     Extremity/Trunk Assessment Upper Extremity Assessment Upper Extremity Assessment: Overall WFL for tasks assessed   Lower Extremity Assessment Lower Extremity Assessment: LLE deficits/detail LLE Deficits / Details: decreased knee extension, able to advance Sharon eg       Communication Communication Communication: No difficulties   Cognition Arousal/Alertness: Awake/alert Behavior During Therapy: WFL for tasks assessed/performed Overall Cognitive Status: Within Functional Limits for tasks assessed                     General Comments       Exercises       Shoulder Instructions      Home Living Family/patient expects to be discharged to:: Private residence Living Arrangements: Other relatives Available  Help at Discharge: Family Type of Home: House Home Access: Level entry     Home Layout: One level     Bathroom Shower/Tub: Tub/shower unit Shower/tub characteristics: Engineer, building servicesCurtain Bathroom Toilet: Standard     Home Equipment: Tub bench;Bedside commode;Walker - standard;Cane - single point   Additional Comments: comfort height toilet      Prior Functioning/Environment Level of Independence: Independent             OT Diagnosis:     OT Problem List:     OT Treatment/Interventions:      OT Goals(Current goals can be found in the care plan section) Acute Rehab OT Goals Patient Stated Goal: to walk without pain  OT Frequency:     Barriers to D/C:            Co-evaluation              End of Session    Activity Tolerance: Patient tolerated treatment well Patient left: in  chair;with call bell/phone within reach;with family/visitor present   Time: 1610-96040917-0942 OT Time Calculation (min): 25 min Charges:  OT General Charges $OT Visit: 1 Procedure OT Evaluation $Initial OT Evaluation Tier I: 1 Procedure OT Treatments $Self Care/Home Management : 8-22 mins G-Codes:    Tecumseh Yeagley 08/19/2013, 10:03 AM  Marica OtterMaryellen Sherrel Ploch, OTR/Sharon [phone] 08/19/2013

## 2013-08-19 NOTE — Evaluation (Signed)
Physical Therapy Evaluation Patient Details Name: Sharon Chung MRN: [id number] DOB: [birth year] Today's Date: 08/19/2013   History of Present Illness  L DATHA  Clinical Impression  Pt is mobilizing very well today. Plan 1 more session then Dc.    Follow Up Recommendations Home health PT    Equipment Recommendations  None recommended by PT    Recommendations for Other Services       Precautions / Restrictions Precautions Precautions: Fall      Mobility  Bed Mobility                  Transfers Overall transfer level: Needs assistance Equipment used: Rolling walker (2 wheeled) Transfers: Sit to/from Stand Sit to Stand: Supervision         General transfer comment: cues for safety as pt standing at bedside without RW.  Ambulation/Gait Ambulation/Gait assistance: Supervision Ambulation Distance (Feet): 150 Feet Assistive device: Rolling walker (2 wheeled) Gait Pattern/deviations: Step-through pattern;Antalgic     General Gait Details: cues for sequence and roll walker.  Stairs            Wheelchair Mobility    Modified Rankin (Stroke Patients Only)       Balance                                             Pertinent Vitals/Pain Pain Assessment: 0-10 Pain Score: 1  Pain Location: L thigh Pain Descriptors / Indicators: Aching Pain Intervention(s): Premedicated before session    Home Living Family/patient expects to be discharged to:: Private residence Living Arrangements: Other relatives Available Help at Discharge: Family Type of Home: House Home Access: Level entry     Home Layout: One level Home Equipment: Environmental consultant - 2 wheels;Cane - single point;Bedside commode;Shower seat - built in Additional Comments: comfort height toilet    Prior Function Level of Independence: Independent               Hand Dominance        Extremity/Trunk Assessment   Upper Extremity Assessment: Overall WFL for tasks assessed            Lower Extremity Assessment: LLE deficits/detail   LLE Deficits / Details: decreased knee extension, able to advance L eg     Communication   Communication: No difficulties  Cognition Arousal/Alertness: Awake/alert Behavior During Therapy: WFL for tasks assessed/performed Overall Cognitive Status: Within Functional Limits for tasks assessed                      General Comments      Exercises        Assessment/Plan    PT Assessment Patient needs continued PT services  PT Diagnosis Difficulty walking   PT Problem List Decreased strength;Decreased range of motion;Decreased activity tolerance;Decreased knowledge of use of DME;Decreased safety awareness;Decreased knowledge of precautions;Pain  PT Treatment Interventions DME instruction;Gait training;Functional mobility training;Therapeutic activities;Therapeutic exercise;Patient/family education   PT Goals (Current goals can be found in the Care Plan section) Acute Rehab PT Goals Patient Stated Goal: to walk without pain PT Goal Formulation: With patient/family Time For Goal Achievement: 08/20/13 Potential to Achieve Goals: Good    Frequency 7X/week   Barriers to discharge        Co-evaluation               End of Session   Activity  Tolerance: Patient tolerated treatment well Patient left: in chair;with family/visitor present Nurse Communication: Mobility status         Time: [id number] PT Time Calculation (min): 34 min   Charges:   PT Evaluation $Initial PT Evaluation Tier I: 1 Procedure PT Treatments $Gait Training: 23-37 mins   PT G Codes:          Sharon Chung, Sharon Chung 08/19/2013, 8:42 AM Sharon Chung PT [phone]

## 2013-08-19 NOTE — Progress Notes (Signed)
Physical Therapy Treatment Patient Details Name: Servando SalinaHilda Geidel MRN: [id number] DOB: 10/13/1955 Today's Date: 08/19/2013    History of Present Illness L DATHA    PT Comments    Pt is ambulating without issue with standard walker. Ready for DC.  Follow Up Recommendations  Home health PT     Equipment Recommendations  None recommended by PT    Recommendations for Other Services       Precautions / Restrictions Precautions Precautions: Fall Restrictions Weight Bearing Restrictions: No    Mobility  Bed Mobility Overal bed mobility: Modified Independent                Transfers Overall transfer level: Needs assistance Equipment used: Rolling walker (2 wheeled) Transfers: Sit to/from Stand Sit to Stand: Supervision         General transfer comment: cues for safety  Ambulation/Gait Ambulation/Gait assistance: Supervision Ambulation Distance (Feet): 150 Feet Assistive device: Standard walker Gait Pattern/deviations: Step-to pattern;Step-through pattern     General Gait Details: cues for sequence    Stairs            Wheelchair Mobility    Modified Rankin (Stroke Patients Only)       Balance                                    Cognition Arousal/Alertness: Awake/alert Behavior During Therapy: WFL for tasks assessed/performed Overall Cognitive Status: Within Functional Limits for tasks assessed                      Exercises Total Joint Exercises Ankle Circles/Pumps: AROM;Both;10 reps;Supine Quad Sets: AROM;10 reps;Supine;Left Short Arc Quad: AROM;10 reps;Supine;Left Heel Slides: AAROM;Left;10 reps;Supine Hip ABduction/ADduction: AAROM;Left;10 reps;Supine    General Comments        Pertinent Vitals/Pain Pain Assessment: 0-10 Pain Score: 6  (initially when standing then 2) Pain Location: L thigh Pain Descriptors / Indicators: Aching Pain Intervention(s): Premedicated before session;Repositioned (pt declined ice)     Home Living Family/patient expects to be discharged to:: Private residence Living Arrangements: Other relatives Available Help at Discharge: Family Type of Home: House Home Access: Level entry   Home Layout: One level Home Equipment: Tub bench;Bedside commode;Cane - single point;Walker - standard Additional Comments: comfort height toilet    Prior Function Level of Independence: Independent          PT Goals (current goals can now be found in the care plan section) Acute Rehab PT Goals Patient Stated Goal: to walk without pain PT Goal Formulation: With patient/family Time For Goal Achievement: 08/20/13 Potential to Achieve Goals: Good Progress towards PT goals: Progressing toward goals    Frequency  7X/week    PT Plan Current plan remains appropriate    Co-evaluation             End of Session   Activity Tolerance: Patient tolerated treatment well Patient left: with family/visitor present     Time: 4098-11911112-1132 PT Time Calculation (min): 20 min  Charges:  $Gait Training: 8-22 mins                    G Codes:      Rada HayHill, Natoria Archibald Elizabeth 08/19/2013, 11:40 AM

## 2013-08-19 NOTE — Progress Notes (Signed)
[facility]   Subjective: 1 Day Post-Op Procedure(s) (LRB): LEFT TOTAL HIP ARTHROPLASTY ANTERIOR APPROACH (Left)   Patient reports pain as mild, pain controlled. No events throughout the night. Ready to be discharged home if she does well with PT.  Objective:   VITALS:   Filed Vitals:   08/19/13 0510  BP: 114/69  Pulse: 56  Temp: 98 F (36.7 C)  Resp: 18    Dorsiflexion/Plantar flexion intact Incision: dressing C/D/I No cellulitis present Compartment soft  LABS  Recent Labs  08/19/13 0511  HGB 10.7*  HCT 29.8*  WBC 9.0  PLT 184     Recent Labs  08/19/13 0511  NA 139  K 3.8  BUN 11  CREATININE 0.59  GLUCOSE 143*     Assessment/Plan: 1 Day Post-Op Procedure(s) (LRB): LEFT TOTAL HIP ARTHROPLASTY ANTERIOR APPROACH (Left) Foley cath d/c'ed Advance diet Up with therapy D/C IV fluids Discharge home with home health [facility]. Follow up with OLIN,Bryan Goin D in 2 weeks.  Contact information:  [facility] [address], [address] [place] [id number] [phone]    Expected ABLA  Treated with iron and will observe  Obese (BMI 30-39.9) Estimated body mass index is 39.7 kg/(m^2) as calculated from the following:   Height as of this encounter: 5\' 1"  (1.549 m).   Weight as of this encounter: 95.255 kg (210 lb). Patient also counseled that weight may inhibit the healing process Patient counseled that losing weight will help with future health issues      Anastasio AuerbachMatthew S. Keyoni Lapinski   PAC  08/19/2013, 8:25 AM

## 2013-08-19 NOTE — Progress Notes (Signed)
[facility]   [facility] is providing the following services: Patient declined rw and commode - has both at home.   If patient discharges after hours, please call [phone].   Renard HamperLecretia Williamson 08/19/2013, 10:05 AM

## 2013-08-20 ENCOUNTER — Encounter (HOSPITAL_COMMUNITY): Payer: Self-pay | Admitting: Orthopedic Surgery

## 2013-08-26 NOTE — Discharge Summary (Signed)
Physician Discharge Summary  Patient ID: Sharon Chung MRN: [id number] DOB/AGE: 60/09/1955 60 y.o.  Admit date: 08/18/2013 Discharge date: 08/19/2013   Procedures:  Procedure(s) (LRB): LEFT TOTAL HIP ARTHROPLASTY ANTERIOR APPROACH (Left)  Attending Physician:  Dr. Durene RomansMatthew Olin   Admission Diagnoses:   Left hip OA / pain  Discharge Diagnoses:  Principal Problem:   S/P left THA, AA Active Problems:   Postoperative anemia due to acute blood loss   Obese  Past Medical History  Diagnosis Date  . Arthritis   . Hypertension   . H/O hiatal hernia     HPI: Sharon SalinaHilda Iden, 58 y.o. female, has a history of pain and functional disability in the left hip(s) due to trauma and patient has failed non-surgical conservative treatments for greater than 12 weeks to include NSAID's and/or analgesics, corticosteriod injections and activity modification. Onset of symptoms was abrupt starting 3+ years ago with gradually worsening course since that time.The patient noted no past surgery on the left hip(s). Patient currently rates pain in the left hip at 10 out of 10 with activity. Patient has night pain, worsening of pain with activity and weight bearing, trendelenberg gait, pain that interfers with activities of daily living and pain with passive range of motion. Patient has evidence of periarticular osteophytes and joint space narrowing by imaging studies. This condition presents safety issues increasing the risk of falls. There is no current active infection. Risks, benefits and expectations were discussed with the patient. Risks including but not limited to the risk of anesthesia, blood clots, nerve damage, blood vessel damage, failure of the prosthesis, infection and up to and including death. Patient understand the risks, benefits and expectations and wishes to proceed with surgery.  PCP: Toma DeitersHASANAJ,XAJE A, MD   Discharged Condition: good  Hospital Course:  Patient underwent the above stated procedure on  08/18/2013. Patient tolerated the procedure well and brought to the recovery room in good condition and subsequently to the floor.  POD #1 BP: 114/69 ; Pulse: 56 ; Temp: 98 F (36.7 C) ; Resp: 18  Patient reports pain as mild, pain controlled. No events throughout the night. Ready to be discharged home.  Dorsiflexion/plantar flexion intact, incision: dressing C/D/I, no cellulitis present and compartment soft.   LABS  Basename    HGB  10.7  HCT  29.8    Discharge Exam: General appearance: alert, cooperative and no distress Extremities: Homans sign is negative, no sign of DVT, no edema, redness or tenderness in the calves or thighs and no ulcers, gangrene or trophic changes  Disposition: Home with follow up in 2 weeks   Follow-up Information   Follow up with [name],Onyx Schirmer D, MD. Schedule an appointment as soon as possible for a visit in 2 weeks.   Specialty:  Orthopedic Surgery   Contact information:   [address] [place] [id number] [phone]       Discharge Instructions   Call MD / Call 911    Complete by:  As directed   If you experience chest pain or shortness of breath, CALL 911 and be transported to the hospital emergency room.  If you develope a fever above 101 F, pus (white drainage) or increased drainage or redness at the wound, or calf pain, call your surgeon's office.     Change dressing    Complete by:  As directed   Maintain surgical dressing for 10-14 days, or until follow up in the clinic.     Constipation Prevention  Complete by:  As directed   Drink plenty of fluids.  Prune juice may be helpful.  You may use a stool softener, such as Colace (over the counter) 100 mg twice a day.  Use MiraLax (over the counter) for constipation as needed.     Diet - low sodium heart healthy    Complete by:  As directed      Discharge instructions    Complete by:  As directed   Maintain surgical dressing for 10-14 days, or until follow up in the clinic.  Follow up in 2 weeks at [facility]. Call with any questions or concerns.     Increase activity slowly as tolerated    Complete by:  As directed      TED hose    Complete by:  As directed   Use stockings (TED hose) for 2 weeks on both leg(s).  You may remove them at night for sleeping.     Weight bearing as tolerated    Complete by:  As directed   Laterality:  left  Extremity:  Lower             Medication List    STOP taking these medications       naproxen sodium 220 MG tablet  Commonly known as:  ANAPROX     traMADol 50 MG tablet  Commonly known as:  ULTRAM      TAKE these medications       amLODipine 5 MG tablet  Commonly known as:  NORVASC  Take 5 mg by mouth every morning.     aspirin 325 MG EC tablet  Take 1 tablet (325 mg total) by mouth daily with breakfast.     bisoprolol-hydrochlorothiazide 5-6.25 MG per tablet  Commonly known as:  ZIAC  Take 1 tablet by mouth every evening.     DSS 100 MG Caps  Take 100 mg by mouth 2 (two) times daily.     ferrous sulfate 325 (65 FE) MG tablet  Take 1 tablet (325 mg total) by mouth 3 (three) times daily after meals.     HYDROcodone-acetaminophen 7.5-325 MG per tablet  Commonly known as:  NORCO  Take 1-2 tablets by mouth every 4 (four) hours as needed for moderate pain.     methocarbamol 500 MG tablet  Commonly known as:  ROBAXIN  Take 1 tablet (500 mg total) by mouth every 6 (six) hours as needed for muscle spasms.     polyethylene glycol packet  Commonly known as:  MIRALAX / GLYCOLAX  Take 17 g by mouth 2 (two) times daily.     simvastatin 20 MG tablet  Commonly known as:  ZOCOR  Take 20 mg by mouth daily at 6 PM.     Vitamin D 2000 UNITS Caps  Take 2,000 Units by mouth daily.         Signed: Anastasio Auerbach. Renise Gillies   PA-C  08/26/2013, 10:38 PM

## 2013-10-13 ENCOUNTER — Encounter: Payer: Self-pay | Admitting: Obstetrics & Gynecology

## 2013-10-13 ENCOUNTER — Encounter: Payer: Self-pay | Admitting: *Deleted

## 2013-10-13 ENCOUNTER — Ambulatory Visit (INDEPENDENT_AMBULATORY_CARE_PROVIDER_SITE_OTHER): Payer: BC Managed Care – PPO | Admitting: Obstetrics & Gynecology

## 2013-10-13 VITALS — BP 108/60 | Ht 63.0 in | Wt 215.0 lb

## 2013-10-13 DIAGNOSIS — N3281 Overactive bladder: Secondary | ICD-10-CM | POA: Insufficient documentation

## 2013-10-13 MED ORDER — MIRABEGRON ER 50 MG PO TB24: 50.0000 mg | ORAL_TABLET | Freq: Every day | ORAL | Status: DC

## 2013-10-13 NOTE — Progress Notes (Signed)
Patient ID: Sharon Chung, female   DOB: 05/15/1955, 58 y.o.   MRN: [id number] Chief Complaint  Patient presents with  . Referral    think bladder has fallen.    HPI Had an episode of acute pressure with lifting 60 pounds off of the floor States she pushed it back up  Gets up to bathroom 4 times nightly, loses urine 1-2 times nightly  ROS No burning with urination, frequency or urgency No nausea, vomiting or diarrhea Nor fever chills or other constitutional symptoms   Blood pressure 108/60, height 5\' 3"  (1.6 m), weight 215 lb (97.523 kg).  EXAM Abdomen:      soft Vulva:            normal appearing vulva with no masses, tenderness or lesions Vagina:          normal mucosa, no discharge, grade I uterine relaxation and minimal bladder relaxation Cervix:           normal appearance Uterus:          uterus is normal size, shape, consistency and nontender Adnexa:         normal adnexa in size, nontender and no masses Rectal:            Hemoccult:                               Assessment/Plan:  Urge incontinence unimpressive degree of POP  Myrbetriq 50 nightly follow up 6 weeks

## 2013-11-24 ENCOUNTER — Ambulatory Visit: Payer: BC Managed Care – PPO | Admitting: Obstetrics & Gynecology

## 2016-04-03 ENCOUNTER — Ambulatory Visit: Payer: BLUE CROSS/BLUE SHIELD | Attending: Internal Medicine | Admitting: Physical Therapy

## 2016-04-03 DIAGNOSIS — R293 Abnormal posture: Secondary | ICD-10-CM | POA: Diagnosis present

## 2016-04-03 DIAGNOSIS — M545 Low back pain: Secondary | ICD-10-CM | POA: Diagnosis present
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
## 2016-04-09 ENCOUNTER — Encounter: Payer: Self-pay | Admitting: Physical Therapy

## 2016-04-09 ENCOUNTER — Ambulatory Visit: Payer: BLUE CROSS/BLUE SHIELD | Attending: Internal Medicine | Admitting: Physical Therapy

## 2016-04-09 DIAGNOSIS — R293 Abnormal posture: Secondary | ICD-10-CM | POA: Insufficient documentation

## 2016-04-09 DIAGNOSIS — M545 Low back pain: Secondary | ICD-10-CM | POA: Insufficient documentation
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
## 2016-04-09 NOTE — Patient Instructions (Addendum)
Piriformis (Supine)    Cross legs, right on top. Gently pull other knee toward chest until stretch is felt in buttock/hip of top leg. Hold __30__ seconds. Repeat _3___ times per set. Do ____ sets per session. Do __3-4__ sessions per day.  http://orth.exer.us/676   Copyright  VHI. All rights reserved.  Knee-to-Chest Stretch: Unilateral    With hand behind right knee, pull knee in to chest until a comfortable stretch is felt in lower back and buttocks. Keep back relaxed. Hold _30___ seconds. Repeat _3___ times per set. Do ____ sets per session. Do _3-4___ sessions per day.  http://orth.exer.us/126   Copyright  VHI. All rights reserved.

## 2016-04-11 ENCOUNTER — Ambulatory Visit: Payer: BLUE CROSS/BLUE SHIELD | Admitting: Physical Therapy

## 2016-04-11 ENCOUNTER — Encounter: Payer: Self-pay | Admitting: Physical Therapy

## 2016-04-11 DIAGNOSIS — R293 Abnormal posture: Secondary | ICD-10-CM

## 2016-04-11 DIAGNOSIS — M545 Low back pain: Secondary | ICD-10-CM

## 2016-04-11 NOTE — Patient Instructions (Signed)
Pelvic Tilt: Posterior - Legs Bent (Supine)   Tighten stomach and flatten back by rolling pelvis down. Hold _10___ seconds. Relax. Repeat _10-30___ times per set. Do __2__ sets per session. Do _2___ sessions per day.  Brushing Teeth    Place one foot on ledge and one hand on counter. Bend other knee slightly to keep back straight.  Copyright  VHI. All rights reserved.  Refrigerator   Squat with knees apart to reach lower shelves and drawers.   Copyright  VHI. All rights reserved.  Laundry Basket   Squat down and hold basket close to stand. Use leg muscles to do the work.   Copyright  VHI. All rights reserved.  Housework - Vacuuming   Hold the vacuum with arm held at side. Step back and forth to move it, keeping head up. Avoid twisting.   Copyright  VHI. All rights reserved.  Housework - Wiping   Position yourself as close as possible to reach work surface. Avoid straining your back.   Copyright  VHI. All rights reserved.  Gardening - Mowing   Keep arms close to sides and walk with lawn mower.   Copyright  VHI. All rights reserved.  Sleeping on Side   Place pillow between knees. Use cervical support under neck and a roll around waist as needed.   Copyright  VHI. All rights reserved.  Log Roll   Lying on back, bend left knee and place left arm across chest. Roll all in one movement to the right. Reverse to roll to the left. Always move as one unit.   Copyright  VHI. All rights reserved.  Stand to Sit / Sit to Stand   To sit: Bend knees to lower self onto front edge of chair, then scoot back on seat. To stand: Reverse sequence by placing one foot forward, and scoot to front of seat. Use rocking motion to stand up.  Copyright  VHI. All rights reserved.  Posture - Standing   Good posture is important. Avoid slouching and forward head thrust. Maintain curve in low back and align ears over shoul- ders, hips over ankles.   Copyright  VHI.  All rights reserved.  Posture - Sitting   Sit upright, head facing forward. Try using a roll to support lower back. Keep shoulders relaxed, and avoid rounded back. Keep hips level with knees. Avoid crossing legs for long periods.   Copyright  VHI. All rights reserved.  Computer Work   Position work to face forward. Use proper work and seat height. Keep shoulders back and down, wrists straight, and elbows at right angles. Use chair that provides full back support. Add footrest and lumbar roll as needed.   Copyright  VHI. All rights reserved.       

## 2016-04-11 NOTE — Therapy (Signed)
[facility] [address] [place], [place], [address] Phone: [phone]   Fax:  [phone]  Physical Therapy Treatment  Patient Details  Name: Sharon Chung MRN: [id number] Date of Birth: [birth year] Referring Provider: Kirby Crigler MD.  Encounter Date: 04/11/2016      PT End of Session - 04/11/16 1145    Visit Number 3   Number of Visits 16   Date for PT Re-Evaluation 06/02/16   PT Start Time 1115   PT Stop Time 1158   PT Time Calculation (min) 43 min   Activity Tolerance Patient tolerated treatment well   Behavior During Therapy [facility] for tasks assessed/performed      Past Medical History:  Diagnosis Date  . Arthritis   . H/O hiatal hernia   . Hypertension     Past Surgical History:  Procedure Laterality Date  . TOTAL HIP ARTHROPLASTY Left 08/18/2013   Procedure: LEFT TOTAL HIP ARTHROPLASTY ANTERIOR APPROACH;  Surgeon: Shelda Pal, MD;  Location: [facility];  Service: Orthopedics;  Laterality: Left;  . TUBAL LIGATION      There were no vitals filed for this visit.      Subjective Assessment - 04/11/16 1122    Subjective Patient reported increased pain today   Pertinent History Left total hip replacement.   Limitations Standing;Walking   How long can you stand comfortably? 5 minutes.   How long can you walk comfortably? 5 minutes or less.   Patient Stated Goals I want to get out of pain and do things like I did before surgery.   Currently in Pain? Yes   Pain Score 9    Pain Location Back   Pain Orientation Right;Lower   Pain Descriptors / Indicators Sore   Pain Type Acute pain   Pain Onset 1 to 4 weeks ago   Pain Frequency Constant   Aggravating Factors  any one position   Pain Relieving Factors at rest                         [facility] Adult PT Treatment/Exercise - 04/11/16 0001      Lumbar Exercises: Supine   Ab Set 3 seconds;20 reps   Glut Set 20 reps;3 seconds     Moist Heat Therapy   Number  Minutes Moist Heat 15 Minutes   Moist Heat Location Lumbar Spine     Electrical Stimulation   Electrical Stimulation Location R glute   Electrical Stimulation Action premod   Electrical Stimulation Parameters 80-150hz x14min   Electrical Stimulation Goals Pain;Tone     Manual Therapy   Manual Therapy Soft tissue mobilization   Soft tissue mobilization STW to R low back/piriformis and glute to reduce tone and pain in L sidelying with pillow between knees for comfort                PT Education - 04/11/16 1147    Education provided Yes   Education Details HEP draw in/core and posture techniques   Person(s) Educated Patient   Methods Explanation;Demonstration;Handout   Comprehension Verbalized understanding;Returned demonstration             PT Long Term Goals - 04/11/16 1148      PT LONG TERM GOAL #1   Title Independent with a HEP.   Time 8   Period Weeks   Status Achieved     PT LONG TERM GOAL #2   Title Stand 20 minutes with pain not > 3/10.  Time 8   Period Weeks   Status On-going     PT LONG TERM GOAL #3   Title Walk her normal distance wiht pain not > 3/10.   Time 8   Period Weeks   Status On-going     PT LONG TERM GOAL #4   Title Eliminate right LE symptoms.   Time 8   Period Weeks   Status On-going     PT LONG TERM GOAL #5   Title Perform ADL's with pain not > 3/10.   Time 8   Period Weeks   Status On-going               Plan - 04/11/16 1148    Clinical Impression Statement Patient arrived with more pain today. Patent reported feeling great after last treatment then doing too much walking which increased her pain. Patient tolerated treatment well today. Patient had palpable pain and tightness in right glut/piriformis area. Educated patient on posture awareness techniques and core actiation. HEP given. Patient metgoal #1 others ongoing due to pain deficits.    Rehab Potential Good   PT Frequency 2x / week   PT Duration 8 weeks    PT Treatment/Interventions ADLs/Self Care Home Management;Cryotherapy;Electrical Stimulation;Moist Heat;Ultrasound;Therapeutic activities;Therapeutic exercise;Patient/family education;Manual techniques;Dry needling   PT Next Visit Plan cont with POC for low level core strengthening/posture/manual STW and modalities PRN   Consulted and Agree with Plan of Care Patient      Patient will benefit from skilled therapeutic intervention in order to improve the following deficits and impairments:  Pain, Decreased activity tolerance, Decreased range of motion, Postural dysfunction  Visit Diagnosis: Acute right-sided low back pain, with sciatica presence unspecified  Abnormal posture     Problem List Patient Active Problem List   Diagnosis Date Noted  . Overactive bladder 10/13/2013  . Postoperative anemia due to acute blood loss 08/19/2013  . Obese 08/19/2013  . S/P left THA, AA 08/18/2013    Sharon Chung P, PTA 04/11/2016, 12:00 PM  [facility] [address] [place], [place], [address] Phone: [phone]   Fax:  [phone]  Name: Sharon Chung MRN: [id number] Date of Birth: 08-Nov-1955

## 2016-04-16 ENCOUNTER — Ambulatory Visit: Payer: BLUE CROSS/BLUE SHIELD | Admitting: Physical Therapy

## 2016-04-16 DIAGNOSIS — M545 Low back pain: Secondary | ICD-10-CM | POA: Diagnosis not present

## 2016-04-16 NOTE — Therapy (Signed)
[facility] [address] [place], [place], [address] Phone: [phone]   Fax:  [phone]  Physical Therapy Treatment  Patient Details  Name: Sharon Chung MRN: [id number] Date of Birth: 1955/12/14 Referring Provider: Kirby Crigler MD.  Encounter Date: 04/16/2016      PT End of Session - 04/16/16 1124    Visit Number 4   Number of Visits 16   Date for PT Re-Evaluation 06/02/16   PT Start Time 1124   PT Stop Time 1223   PT Time Calculation (min) 59 min   Activity Tolerance Patient tolerated treatment well   Behavior During Therapy [facility] for tasks assessed/performed      Past Medical History:  Diagnosis Date  . Arthritis   . H/O hiatal hernia   . Hypertension     Past Surgical History:  Procedure Laterality Date  . TOTAL HIP ARTHROPLASTY Left 08/18/2013   Procedure: LEFT TOTAL HIP ARTHROPLASTY ANTERIOR APPROACH;  Surgeon: Shelda Pal, MD;  Location: [facility];  Service: Orthopedics;  Laterality: Left;  . TUBAL LIGATION      There were no vitals filed for this visit.      Subjective Assessment - 04/16/16 1124    Subjective Patient states that her pain is much better than it was. She feels it mainly in the R hip, but is still very weak in RLE.   Pertinent History Left total hip replacement.   Limitations Standing;Walking   How long can you stand comfortably? 30 minutes (until leg gets weak)   How long can you walk comfortably? 45 min (until leg gets week)   Patient Stated Goals I want to get out of pain and do things like I did before surgery.   Currently in Pain? Yes   Pain Score 2    Pain Location Hip   Pain Orientation Right   Pain Descriptors / Indicators Sore   Pain Type Acute pain                         [facility] Adult PT Treatment/Exercise - 04/16/16 0001      Modalities   Modalities Electrical Stimulation;Moist Heat     Moist Heat Therapy   Number Minutes Moist Heat 15 Minutes   Moist Heat  Location Hip     Electrical Stimulation   Electrical Stimulation Location R gluteals/piriformis   Electrical Stimulation Action premod   Electrical Stimulation Parameters 80-150 Hz x 15 min   Electrical Stimulation Goals Pain;Tone     Manual Therapy   Manual Therapy Soft tissue mobilization;Myofascial release   Soft tissue mobilization to R gluteals piriformis and lateral Quads   Myofascial Release to R ITB           Trigger Point Dry Needling - 04/16/16 1220    Consent Given? Yes   Education Handout Provided Yes   Muscles Treated Lower Body Gluteus minimus;Piriformis;Gluteus maximus;Quadriceps  R   Gluteus Maximus Response Twitch response elicited;Palpable increased muscle length   Gluteus Minimus Response Twitch response elicited;Palpable increased muscle length   Piriformis Response Twitch response elicited;Palpable increased muscle length   Quadriceps Response Twitch response elicited;Palpable increased muscle length  proximal and distal lateral HS              PT Education - 04/16/16 1213    Education provided Yes   Education Details HEP and dry needling education and aftercare   Person(s) Educated Patient   Methods Explanation;Demonstration;Handout;Tactile cues;Verbal cues  Comprehension Verbalized understanding;Returned demonstration             PT Long Term Goals - 04/11/16 1148      PT LONG TERM GOAL #1   Title Independent with a HEP.   Time 8   Period Weeks   Status Achieved     PT LONG TERM GOAL #2   Title Stand 20 minutes with pain not > 3/10.   Time 8   Period Weeks   Status On-going     PT LONG TERM GOAL #3   Title Walk her normal distance wiht pain not > 3/10.   Time 8   Period Weeks   Status On-going     PT LONG TERM GOAL #4   Title Eliminate right LE symptoms.   Time 8   Period Weeks   Status On-going     PT LONG TERM GOAL #5   Title Perform ADL's with pain not > 3/10.   Time 8   Period Weeks   Status On-going                Plan - 04/16/16 1213    Clinical Impression Statement Patient presents with less pain today and reports that manual therapy last visit really helped. She continues to have point tenderness in R gluteals, piriformis and along R ITB. She responded well to DN in these areas. She demos marked tightness in the R ITB and piriformis and will benefit from stretching.    PT Treatment/Interventions ADLs/Self Care Home Management;Cryotherapy;Electrical Stimulation;Moist Heat;Ultrasound;Therapeutic activities;Therapeutic exercise;Patient/family education;Manual techniques;Dry needling   PT Next Visit Plan Assess DN; continue with maual therapy and stretching for R piriformis and ITB; modalities prn      Patient will benefit from skilled therapeutic intervention in order to improve the following deficits and impairments:  Pain, Decreased activity tolerance, Decreased range of motion, Postural dysfunction  Visit Diagnosis: Acute right-sided low back pain, with sciatica presence unspecified     Problem List Patient Active Problem List   Diagnosis Date Noted  . Overactive bladder 10/13/2013  . Postoperative anemia due to acute blood loss 08/19/2013  . Obese 08/19/2013  . S/P left THA, AA 08/18/2013   Solon Palm PT 04/16/2016, 12:22 PM  [facility] [address] [place], [place], [address] Phone: [phone]   Fax:  [phone]  Name: Sharon Chung MRN: [id number] Date of Birth: 1955/12/20

## 2016-04-16 NOTE — Patient Instructions (Addendum)
  Trigger Point Dry Needling  . What is Trigger Point Dry Needling (DN)? o DN is a physical therapy technique used to treat muscle pain and dysfunction. Specifically, DN helps deactivate muscle trigger points (muscle knots).  o A thin filiform needle is used to penetrate the skin and stimulate the underlying trigger point. The goal is for a local twitch response (LTR) to occur and for the trigger point to relax. No medication of any kind is injected during the procedure.   . What Does Trigger Point Dry Needling Feel Like?  o The procedure feels different for each individual patient. Some patients report that they do not actually feel the needle enter the skin and overall the process is not painful. Very mild bleeding may occur. However, many patients feel a deep cramping in the muscle in which the needle was inserted. This is the local twitch response.   Marland Kitchen How Will I feel after the treatment? o Soreness is normal, and the onset of soreness may not occur for a few hours. Typically this soreness does not last longer than two days.  o Bruising is uncommon, however; ice can be used to decrease any possible bruising.  o In rare cases feeling tired or nauseous after the treatment is normal. In addition, your symptoms may get worse before they get better, this period will typically not last longer than 24 hours.   . What Can I do After My Treatment? o Increase your hydration by drinking more water for the next 24 hours. o You may place ice or heat on the areas treated that have become sore, however, do not use heat on inflamed or bruised areas. Heat often brings more relief post needling. o You can continue your regular activities, but vigorous activity is not recommended initially after the treatment for 24 hours. o DN is best combined with other physical therapy such as strengthening, stretching, and other therapies.    Precautions:  In some cases, dry needling is done over the lung field. While  rare, there is a risk of pneumothorax (punctured lung). Because of this, if you ever experience shortness of breath on exertion, difficulty taking a deep breath, chest pain or a dry cough following dry needling, you should report to an emergency room and tell them that you have been dry needled over the thorax.   Iliotibial Band Stretch, Side-Lying   Lie on side, back to edge of bed, top arm in front. Allow top leg to drape behind over edge. Hold 30 or more seconds.  Repeat 3 times per session. Do _2-3 sessions per day.   Solon Palm, PT 04/16/16 12:12 PM [facility] [address] [place], [place], [address] Phone: [phone]   Fax:  [phone]

## 2016-04-18 ENCOUNTER — Ambulatory Visit: Payer: BLUE CROSS/BLUE SHIELD | Admitting: Physical Therapy

## 2016-04-18 ENCOUNTER — Encounter: Payer: Self-pay | Admitting: Physical Therapy

## 2016-04-18 DIAGNOSIS — R293 Abnormal posture: Secondary | ICD-10-CM

## 2016-04-18 DIAGNOSIS — M545 Low back pain: Secondary | ICD-10-CM

## 2016-04-18 NOTE — Therapy (Signed)
[facility] [address] [place], [place], [address] Phone: [phone]   Fax:  [phone]  Physical Therapy Treatment  Patient Details  Name: Sharon Chung MRN: [id number] Date of Birth: Sep 20, 1955 Referring Provider: Kirby Crigler MD.  Encounter Date: 04/18/2016      PT End of Session - 04/18/16 1122    Visit Number 5   Number of Visits 16   Date for PT Re-Evaluation 06/02/16   PT Start Time 1118   PT Stop Time 1200   PT Time Calculation (min) 42 min      Past Medical History:  Diagnosis Date  . Arthritis   . H/O hiatal hernia   . Hypertension     Past Surgical History:  Procedure Laterality Date  . TOTAL HIP ARTHROPLASTY Left 08/18/2013   Procedure: LEFT TOTAL HIP ARTHROPLASTY ANTERIOR APPROACH;  Surgeon: Shelda Pal, MD;  Location: [facility];  Service: Orthopedics;  Laterality: Left;  . TUBAL LIGATION      There were no vitals filed for this visit.      Subjective Assessment - 04/18/16 1121    Subjective Reports feeling much better after the dry needling but still has the weakness in RLE. Thinks she can walk better and longer now.   Pertinent History Left total hip replacement.   Limitations Standing;Walking   How long can you stand comfortably? 30 minutes (until leg gets weak)   How long can you walk comfortably? 45 min (until leg gets week)   Patient Stated Goals I want to get out of pain and do things like I did before surgery.   Currently in Pain? No/denies            [facility] PT Assessment - 04/18/16 0001      Assessment   Medical Diagnosis Low back pain.   Onset Date/Surgical Date 03/13/16   Next MD Visit 04/26/2016     Precautions   Precautions None     Restrictions   Weight Bearing Restrictions No                     [facility] Adult PT Treatment/Exercise - 04/18/16 0001      Lumbar Exercises: Aerobic   Stationary Bike NuStep L3 x15 min with VCs for core draw in     Lumbar Exercises:  Standing   Row Strengthening;Both;20 reps   Row Limitations Pink XTS with staggered stance     Lumbar Exercises: Supine   Clam 20 reps  with red theraband   Bent Knee Raise 20 reps   Bridge 20 reps   Straight Leg Raise 20 reps                     PT Long Term Goals - 04/11/16 1148      PT LONG TERM GOAL #1   Title Independent with a HEP.   Time 8   Period Weeks   Status Achieved     PT LONG TERM GOAL #2   Title Stand 20 minutes with pain not > 3/10.   Time 8   Period Weeks   Status On-going     PT LONG TERM GOAL #3   Title Walk her normal distance wiht pain not > 3/10.   Time 8   Period Weeks   Status On-going     PT LONG TERM GOAL #4   Title Eliminate right LE symptoms.   Time 8   Period Weeks   Status On-going  PT LONG TERM GOAL #5   Title Perform ADL's with pain not > 3/10.   Time 8   Period Weeks   Status On-going               Plan - 04/18/16 1214    Clinical Impression Statement Patient presented in clinic with rave reviews of dry needling as she felt so much better following DN. Therapeutic exercises encouraged this treatment with VCs provided throughout treatment for core activation. Patient continues to have difficulty with RLE activiities due to weakness. Patient encouraged to continue HEP as was provided and attempt short distance walking and to stop if pain started. Patient denied any modalities today as she did not have pain.   Rehab Potential Good   PT Frequency 2x / week   PT Duration 8 weeks   PT Treatment/Interventions ADLs/Self Care Home Management;Cryotherapy;Electrical Stimulation;Moist Heat;Ultrasound;Therapeutic activities;Therapeutic exercise;Patient/family education;Manual techniques;Dry needling   PT Next Visit Plan Assess DN; continue with maual therapy and stretching for R piriformis and ITB; modalities prn   Consulted and Agree with Plan of Care Patient      Patient will benefit from skilled therapeutic  intervention in order to improve the following deficits and impairments:  Pain, Decreased activity tolerance, Decreased range of motion, Postural dysfunction  Visit Diagnosis: Acute right-sided low back pain, with sciatica presence unspecified  Abnormal posture     Problem List Patient Active Problem List   Diagnosis Date Noted  . Overactive bladder 10/13/2013  . Postoperative anemia due to acute blood loss 08/19/2013  . Obese 08/19/2013  . S/P left THA, AA 08/18/2013    Evelene Croon, PTA 04/18/2016, 12:19 PM  [facility] [address] [place], [place], [address] Phone: [phone]   Fax:  [phone]  Name: Sharon Chung MRN: [id number] Date of Birth: 29-Dec-1955

## 2016-04-24 ENCOUNTER — Ambulatory Visit: Payer: BLUE CROSS/BLUE SHIELD | Admitting: Physical Therapy

## 2016-04-24 DIAGNOSIS — M545 Low back pain: Secondary | ICD-10-CM

## 2016-04-24 NOTE — Therapy (Signed)
[facility] [address] [place], [place], [address] Phone: [phone]   Fax:  [phone]  Physical Therapy Treatment  Patient Details  Name: Sharon Chung MRN: [id number] Date of Birth: [birth year] Referring Provider: Kirby Crigler MD.  Encounter Date: 04/24/2016      PT End of Session - 04/24/16 1120    Visit Number 6   Number of Visits 16   Date for PT Re-Evaluation 06/02/16   PT Start Time 1117   PT Stop Time 1220   PT Time Calculation (min) 63 min   Activity Tolerance Patient tolerated treatment well   Behavior During Therapy [facility] for tasks assessed/performed      Past Medical History:  Diagnosis Date  . Arthritis   . H/O hiatal hernia   . Hypertension     Past Surgical History:  Procedure Laterality Date  . TOTAL HIP ARTHROPLASTY Left 08/18/2013   Procedure: LEFT TOTAL HIP ARTHROPLASTY ANTERIOR APPROACH;  Surgeon: Shelda Pal, MD;  Location: [facility];  Service: Orthopedics;  Laterality: Left;  . TUBAL LIGATION      There were no vitals filed for this visit.      Subjective Assessment - 04/24/16 1120    Subjective Patient states she if feeling "better than I ever have". She also reports she feels less weak.   Pertinent History Left total hip replacement.   Limitations Standing;Walking   Patient Stated Goals I want to get out of pain and do things like I did before surgery.   Currently in Pain? Yes   Pain Score 3    Pain Location Hip   Pain Orientation Right   Pain Descriptors / Indicators Sore   Pain Type Acute pain   Pain Radiating Towards to mid thigh as of 04/24/16   Pain Onset 1 to 4 weeks ago   Pain Frequency Constant   Aggravating Factors  prolonged walking   Pain Relieving Factors rest                         [facility] Adult PT Treatment/Exercise - 04/24/16 0001      Lumbar Exercises: Stretches   Single Knee to Chest Stretch 1 rep;60 seconds   Piriformis Stretch 60 seconds;3 reps   multiple positions attempted; supine, sitting, stdg, SDLY   Piriformis Stretch Limitations limited range and body type limit ability to stretch affectively; best is SDLY     Lumbar Exercises: Aerobic   Stationary Bike NuStep L5 x15 min with VCs for core draw in     Modalities   Modalities Electrical Stimulation;Moist Heat     Moist Heat Therapy   Number Minutes Moist Heat 15 Minutes   Moist Heat Location Hip     Electrical Stimulation   Electrical Stimulation Location R gluteals and piriformis   Electrical Stimulation Action premod   Electrical Stimulation Parameters 80-150 Hz x 15 min   Electrical Stimulation Goals Pain;Tone     Manual Therapy   Manual Therapy Soft tissue mobilization;Myofascial release   Soft tissue mobilization to R gluteals, piriformis and lateral quads   Myofascial Release to R ITB with TPR          Trigger Point Dry Needling - 04/24/16 1215    Consent Given? Yes   Education Handout Provided No   Muscles Treated Lower Body Gluteus maximus;Piriformis;Quadriceps  R and gluteus medius   Gluteus Maximus Response Twitch response elicited;Palpable increased muscle length   Piriformis Response Twitch response elicited;Palpable  increased muscle length   Quadriceps Response Twitch response elicited;Palpable increased muscle length  lateral/proximal                   PT Long Term Goals - 04/11/16 1148      PT LONG TERM GOAL #1   Title Independent with a HEP.   Time 8   Period Weeks   Status Achieved     PT LONG TERM GOAL #2   Title Stand 20 minutes with pain not > 3/10.   Time 8   Period Weeks   Status On-going     PT LONG TERM GOAL #3   Title Walk her normal distance wiht pain not > 3/10.   Time 8   Period Weeks   Status On-going     PT LONG TERM GOAL #4   Title Eliminate right LE symptoms.   Time 8   Period Weeks   Status On-going     PT LONG TERM GOAL #5   Title Perform ADL's with pain not > 3/10.   Time 8   Period Weeks    Status On-going               Plan - 04/24/16 1224    Clinical Impression Statement Patient continues to report improvement in pain and mobility. She still has TPs primarily in R piriformis and gluteals and responded well to DN again today. We worked on piriformis stretches today in multiple positions. SDLY stretch was issued for HEP.   PT Treatment/Interventions ADLs/Self Care Home Management;Cryotherapy;Electrical Stimulation;Moist Heat;Ultrasound;Therapeutic activities;Therapeutic exercise;Patient/family education;Manual techniques;Dry needling   PT Next Visit Plan Assess DN; continue with maual therapy and stretching for R piriformis and ITB; modalities prn   PT Home Exercise Plan piriformis and ITB stretches, SKTC   Consulted and Agree with Plan of Care Patient      Patient will benefit from skilled therapeutic intervention in order to improve the following deficits and impairments:  Pain, Decreased activity tolerance, Decreased range of motion, Postural dysfunction  Visit Diagnosis: Acute right-sided low back pain, with sciatica presence unspecified     Problem List Patient Active Problem List   Diagnosis Date Noted  . Overactive bladder 10/13/2013  . Postoperative anemia due to acute blood loss 08/19/2013  . Obese 08/19/2013  . S/P left THA, AA 08/18/2013   Solon Palm PT 04/24/2016, 12:38 PM  [facility] [address] [place], [place], [address] Phone: [phone]   Fax:  [phone]  Name: Sharon Chung MRN: [id number] Date of Birth: 10-13-1955

## 2016-04-24 NOTE — Patient Instructions (Signed)
  Lateral hip stretch   Lie on side facing edge of bed, top arm behind. Allow top leg to drape over edge. Hold _60 or more seconds.  Repeat 3___ times per session. Do _2__ sessions per day.   Knee to Chest (Flexion)   Pull knee toward chest. Feel stretch in lower back or buttock area. You may need to put the other leg straight to feel a better stretch. Breathing deeply, Hold __60__ seconds. Repeat with other knee. Repeat _3___ times each leg. Do _2___ sessions per day.   Solon Palm, PT 04/24/16 12:23 PM [facility] [address] [place], [place], [address] Phone: [phone]   Fax:  [phone]

## 2016-04-25 ENCOUNTER — Ambulatory Visit: Payer: BLUE CROSS/BLUE SHIELD | Admitting: Physical Therapy

## 2016-04-25 ENCOUNTER — Encounter: Payer: Self-pay | Admitting: Physical Therapy

## 2016-04-25 DIAGNOSIS — R293 Abnormal posture: Secondary | ICD-10-CM

## 2016-04-25 DIAGNOSIS — M545 Low back pain: Secondary | ICD-10-CM

## 2016-04-25 NOTE — Therapy (Signed)
[facility] [address] [place], [place], [address] Phone: [phone]   Fax:  [phone]  Physical Therapy Treatment  Patient Details  Name: [place] Chung MRN: [id number] Date of Birth: 1955-08-22 Referring Provider: Kirby Crigler MD.  Encounter Date: 04/25/2016      PT End of Session - 04/25/16 1117    Visit Number 7   Number of Visits 16   Date for PT Re-Evaluation 06/02/16   PT Start Time 1115   PT Stop Time 1155   PT Time Calculation (min) 40 min   Activity Tolerance Patient tolerated treatment well   Behavior During Therapy [facility] for tasks assessed/performed      Past Medical History:  Diagnosis Date  . Arthritis   . H/O hiatal hernia   . Hypertension     Past Surgical History:  Procedure Laterality Date  . TOTAL HIP ARTHROPLASTY Left 08/18/2013   Procedure: LEFT TOTAL HIP ARTHROPLASTY ANTERIOR APPROACH;  Surgeon: Shelda Pal, MD;  Location: [facility];  Service: Orthopedics;  Laterality: Left;  . TUBAL LIGATION      There were no vitals filed for this visit.      Subjective Assessment - 04/25/16 1109    Subjective States that she was needled again and got more tightness out per patient report. Reports walking pain 2-3/10 pain and becomes an ache. Reports that she may be doing yard work this afternoon.   Pertinent History Left total hip replacement.   Limitations Standing;Walking   How long can you stand comfortably? 30 minutes (until leg gets weak)   How long can you walk comfortably? 45 min (until leg gets week)   Patient Stated Goals I want to get out of pain and do things like I did before surgery.   Currently in Pain? Yes   Pain Score 3    Pain Location Back   Pain Orientation Lower;Right   Pain Descriptors / Indicators Sore   Pain Type Acute pain   Pain Onset 1 to 4 weeks ago            [facility] PT Assessment - 04/25/16 0001      Assessment   Medical Diagnosis Low back pain.   Onset Date/Surgical Date  03/13/16   Next MD Visit 04/26/2016     Precautions   Precautions None     Restrictions   Weight Bearing Restrictions No                     [facility] Adult PT Treatment/Exercise - 04/25/16 0001      Lumbar Exercises: Stretches   Single Knee to Chest Stretch 3 reps;30 seconds   Piriformis Stretch 3 reps;30 seconds   Piriformis Stretch Limitations Sidelying     Lumbar Exercises: Aerobic   Stationary Bike NuStep L5 x10 min with VCs for core draw in     Lumbar Exercises: Standing   Row Strengthening;Both;20 reps   Row Limitations Pink XTS with core activation/staggered stance     Lumbar Exercises: Supine   Ab Set 20 reps;5 seconds   Clam 20 reps   Heel Slides 20 reps   Bent Knee Raise 20 reps   Bridge 20 reps;3 seconds   Straight Leg Raise 20 reps     Lumbar Exercises: Sidelying   Hip Abduction 20 reps          Trigger Point Dry Needling - 04/24/16 1215    Consent Given? Yes   Education Handout Provided No  Muscles Treated Lower Body Gluteus maximus;Piriformis;Quadriceps  R and gluteus medius   Gluteus Maximus Response Twitch response elicited;Palpable increased muscle length   Piriformis Response Twitch response elicited;Palpable increased muscle length   Quadriceps Response Twitch response elicited;Palpable increased muscle length  lateral/proximal                   PT Long Term Goals - 04/11/16 1148      PT LONG TERM GOAL #1   Title Independent with a HEP.   Time 8   Period Weeks   Status Achieved     PT LONG TERM GOAL #2   Title Stand 20 minutes with pain not > 3/10.   Time 8   Period Weeks   Status On-going     PT LONG TERM GOAL #3   Title Walk her normal distance wiht pain not > 3/10.   Time 8   Period Weeks   Status On-going     PT LONG TERM GOAL #4   Title Eliminate right LE symptoms.   Time 8   Period Weeks   Status On-going     PT LONG TERM GOAL #5   Title Perform ADL's with pain not > 3/10.   Time 8   Period  Weeks   Status On-going               Plan - 04/25/16 1155    Clinical Impression Statement Patient continues to report improvement following dry needling. Patient able to complete all core/lumbar strengthening well with minimal to moderate multimodal cueing for proper exercise technique. Hip weakness noted in BLE today with exercises. Low back and hip stretching completed as patient states that she will be doing yard work this afternoon. Patient educated to rest due to pain or discomfort as to not over exaggerate LBP or hip discomfort.   Rehab Potential Good   PT Frequency 2x / week   PT Duration 8 weeks   PT Treatment/Interventions ADLs/Self Care Home Management;Cryotherapy;Electrical Stimulation;Moist Heat;Ultrasound;Therapeutic activities;Therapeutic exercise;Patient/family education;Manual techniques;Dry needling   PT Next Visit Plan Assess DN; continue with maual therapy and stretching for R piriformis and ITB; modalities prn   PT Home Exercise Plan piriformis and ITB stretches, SKTC   Consulted and Agree with Plan of Care Patient      Patient will benefit from skilled therapeutic intervention in order to improve the following deficits and impairments:  Pain, Decreased activity tolerance, Decreased range of motion, Postural dysfunction  Visit Diagnosis: Acute right-sided low back pain, with sciatica presence unspecified  Abnormal posture     Problem List Patient Active Problem List   Diagnosis Date Noted  . Overactive bladder 10/13/2013  . Postoperative anemia due to acute blood loss 08/19/2013  . Obese 08/19/2013  . S/P left THA, AA 08/18/2013    Evelene Croon, PTA 04/25/2016, 12:02 PM  [facility] [address] [place], [place], [address] Phone: [phone]   Fax:  [phone]  Name: [place] Chung MRN: [id number] Date of Birth: 1955/06/25

## 2016-05-01 ENCOUNTER — Ambulatory Visit: Payer: BLUE CROSS/BLUE SHIELD | Admitting: Physical Therapy

## 2016-05-01 DIAGNOSIS — M545 Low back pain: Secondary | ICD-10-CM | POA: Diagnosis not present

## 2016-05-01 NOTE — Therapy (Signed)
[facility] [address] [place], [place], [address] Phone: [phone]   Fax:  [phone]  Physical Therapy Treatment  Patient Details  Name: [place] Chung MRN: [id number] Date of Birth: [birth year] Referring Provider: Kirby Crigler MD.  Encounter Date: 05/01/2016      PT End of Session - 05/01/16 1120    Visit Number 8   Number of Visits 16   Date for PT Re-Evaluation 06/02/16   PT Start Time 1115   PT Stop Time 1214   PT Time Calculation (min) 59 min   Activity Tolerance Patient tolerated treatment well   Behavior During Therapy [facility] for tasks assessed/performed      Past Medical History:  Diagnosis Date  . Arthritis   . H/O hiatal hernia   . Hypertension     Past Surgical History:  Procedure Laterality Date  . TOTAL HIP ARTHROPLASTY Left 08/18/2013   Procedure: LEFT TOTAL HIP ARTHROPLASTY ANTERIOR APPROACH;  Surgeon: Shelda Pal, MD;  Location: [facility];  Service: Orthopedics;  Laterality: Left;  . TUBAL LIGATION      There were no vitals filed for this visit.      Subjective Assessment - 05/01/16 1120    Subjective Reports she is able to amb about 0.5 mile but experiences weakness and pain gets up to 6/10.   Pertinent History Left total hip replacement.   Limitations Standing;Walking   How long can you stand comfortably? 30 minutes (until leg gets weak)   How long can you walk comfortably? 45 min (until leg gets week)   Patient Stated Goals I want to get out of pain and do things like I did before surgery.   Currently in Pain? Yes   Pain Score 2    Pain Location Back   Pain Orientation Lower;Right   Pain Descriptors / Indicators Sore   Pain Type Acute pain   Pain Radiating Towards to mid thigh as of 05/01/16                         [facility] Adult PT Treatment/Exercise - 05/01/16 0001      Lumbar Exercises: Aerobic   Stationary Bike NuStep L5 x10 min with VCs for core draw in     Lumbar  Exercises: Standing   Row Strengthening;Both;20 reps  5 second hold   Row Limitations Pink XTS with core activation/staggered stance   Other Standing Lumbar Exercises Hip ABD against wall x 6 on R; 10 on left     Lumbar Exercises: Supine   Clam 20 reps   Clam Limitations green band with Abs set   Bridge 5 reps  10 second hold with green band   Straight Leg Raise 10 reps  with Ab set     Lumbar Exercises: Sidelying   Clam 10 reps   Clam Limitations green band   Hip Abduction --  unable to perform without compensating with quad     Modalities   Modalities Electrical Stimulation;Moist Heat     Moist Heat Therapy   Number Minutes Moist Heat 15 Minutes   Moist Heat Location Hip     Electrical Stimulation   Electrical Stimulation Location R gluteals   Electrical Stimulation Action premod   Electrical Stimulation Parameters 80-150 Hz x 15 min   Electrical Stimulation Goals Pain                     PT Long Term Goals - 05/01/16  1124      PT LONG TERM GOAL #1   Title Independent with a HEP.   Time 8   Period Weeks   Status Achieved     PT LONG TERM GOAL #2   Title Stand 20 minutes with pain not > 3/10.   Baseline able to stand 45 min with <3/10 pain then fatigues   Time 8   Period Weeks   Status Achieved     PT LONG TERM GOAL #3   Title Walk her normal distance wiht pain not > 3/10.   Baseline 6/10 at 0.5 mile   Time 8   Period Weeks   Status On-going     PT LONG TERM GOAL #4   Title Eliminate right LE symptoms.   Time 8   Period Weeks   Status On-going     PT LONG TERM GOAL #5   Title Perform ADL's with pain not > 3/10.   Baseline 4/10 as of 05/01/16   Time 8   Period Weeks   Status On-going               Plan - 05/01/16 1208    Clinical Impression Statement Patient reports she is doing better and able to walk further than she has in a while. She has significant weakness in R glut med and will compensate if not verbally cued. Her  main c/o of pain is in her R hip.    Rehab Potential Good   PT Frequency 2x / week   PT Duration 8 weeks   PT Treatment/Interventions ADLs/Self Care Home Management;Cryotherapy;Electrical Stimulation;Moist Heat;Ultrasound;Therapeutic activities;Therapeutic exercise;Patient/family education;Manual techniques;Dry needling   PT Next Visit Plan Continue with glut med strengthening; DN prn; continue with maual therapy and stretching for R piriformis and ITB; modalities prn   PT Home Exercise Plan piriformis and ITB stretches, SKTC   Consulted and Agree with Plan of Care Patient      Patient will benefit from skilled therapeutic intervention in order to improve the following deficits and impairments:  Pain, Decreased activity tolerance, Decreased range of motion, Postural dysfunction  Visit Diagnosis: Acute right-sided low back pain, with sciatica presence unspecified     Problem List Patient Active Problem List   Diagnosis Date Noted  . Overactive bladder 10/13/2013  . Postoperative anemia due to acute blood loss 08/19/2013  . Obese 08/19/2013  . S/P left THA, AA 08/18/2013    Solon Palm PT 05/01/2016, 12:13 PM  [facility] [address] [place], [place], [address] Phone: [phone]   Fax:  [phone]  Name: [place] Chung MRN: [id number] Date of Birth: 1955/07/23

## 2016-05-02 ENCOUNTER — Encounter: Payer: Self-pay | Admitting: Physical Therapy

## 2016-05-02 ENCOUNTER — Ambulatory Visit: Payer: BLUE CROSS/BLUE SHIELD | Admitting: Physical Therapy

## 2016-05-02 DIAGNOSIS — M545 Low back pain: Secondary | ICD-10-CM

## 2016-05-02 DIAGNOSIS — R293 Abnormal posture: Secondary | ICD-10-CM

## 2016-05-02 NOTE — Therapy (Signed)
[facility] [address] [place], [place], [address] Phone: [phone]   Fax:  [phone]  Physical Therapy Treatment  Patient Details  Name: [place] Chung MRN: [id number] Date of Birth: September 13, 1955 Referring Provider: Kirby Crigler MD.  Encounter Date: 05/02/2016      PT End of Session - 05/02/16 1108    Visit Number 9   Number of Visits 16   Date for PT Re-Evaluation 06/02/16   PT Start Time 1116   PT Stop Time 1158   PT Time Calculation (min) 42 min   Activity Tolerance Patient tolerated treatment well   Behavior During Therapy [facility] for tasks assessed/performed      Past Medical History:  Diagnosis Date  . Arthritis   . H/O hiatal hernia   . Hypertension     Past Surgical History:  Procedure Laterality Date  . TOTAL HIP ARTHROPLASTY Left 08/18/2013   Procedure: LEFT TOTAL HIP ARTHROPLASTY ANTERIOR APPROACH;  Surgeon: Shelda Pal, MD;  Location: [facility];  Service: Orthopedics;  Laterality: Left;  . TUBAL LIGATION      There were no vitals filed for this visit.      Subjective Assessment - 05/02/16 1108    Subjective Reports that her back feels alright and the weakness in her hip is still occuring intermittantly.   Pertinent History Left total hip replacement.   Limitations Standing;Walking   How long can you stand comfortably? 30 minutes (until leg gets weak)   How long can you walk comfortably? 45 min (until leg gets week)   Patient Stated Goals I want to get out of pain and do things like I did before surgery.   Currently in Pain? Yes   Pain Score 3    Pain Location Back   Pain Orientation Lower   Pain Descriptors / Indicators Discomfort   Pain Type Acute pain   Pain Onset 1 to 4 weeks ago            [facility] PT Assessment - 05/02/16 0001      Assessment   Medical Diagnosis Low back pain.   Onset Date/Surgical Date 03/13/16   Next MD Visit 06/2016     Precautions   Precautions None     Restrictions   Weight Bearing Restrictions No                     [facility] Adult PT Treatment/Exercise - 05/02/16 0001      Lumbar Exercises: Aerobic   Stationary Bike NuStep L5 x15 min with VCs for core draw in     Lumbar Exercises: Standing   Other Standing Lumbar Exercises B standing hip flexion and abduction x20 reps each  with VCs for core activation     Lumbar Exercises: Supine   Heel Slides 20 reps;15 reps   Bridge 20 reps   Bridge Limitations with green theraband clam   Straight Leg Raise 20 reps     Lumbar Exercises: Sidelying   Clam 20 reps   Clam Limitations green band   Hip Abduction 20 reps                     PT Long Term Goals - 05/01/16 1124      PT LONG TERM GOAL #1   Title Independent with a HEP.   Time 8   Period Weeks   Status Achieved     PT LONG TERM GOAL #2   Title Stand 20 minutes with  pain not > 3/10.   Baseline able to stand 45 min with <3/10 pain then fatigues   Time 8   Period Weeks   Status Achieved     PT LONG TERM GOAL #3   Title Walk her normal distance wiht pain not > 3/10.   Baseline 6/10 at 0.5 mile   Time 8   Period Weeks   Status On-going     PT LONG TERM GOAL #4   Title Eliminate right LE symptoms.   Time 8   Period Weeks   Status On-going     PT LONG TERM GOAL #5   Title Perform ADL's with pain not > 3/10.   Baseline 4/10 as of 05/01/16   Time 8   Period Weeks   Status On-going               Plan - 05/02/16 1201    Clinical Impression Statement Patient tolerated today's treatment fairly well as more exercises completed to strengthening low back and hips. No complaints with any standing or sidelying exercises today. Core activation VCs provided throughout treatment. Patient limited with L heel slides due to burning sensation in L anterior hip. Patient reported that she is going to start her hip hike exercises again prior to leaving treatment room.   Rehab Potential Good   PT Frequency 2x / week   PT  Duration 8 weeks   PT Treatment/Interventions ADLs/Self Care Home Management;Cryotherapy;Electrical Stimulation;Moist Heat;Ultrasound;Therapeutic activities;Therapeutic exercise;Patient/family education;Manual techniques;Dry needling   PT Next Visit Plan Continue with glut med strengthening; DN prn; continue with maual therapy and stretching for R piriformis and ITB; modalities prn   PT Home Exercise Plan piriformis and ITB stretches, SKTC   Consulted and Agree with Plan of Care Patient      Patient will benefit from skilled therapeutic intervention in order to improve the following deficits and impairments:  Pain, Decreased activity tolerance, Decreased range of motion, Postural dysfunction  Visit Diagnosis: Acute right-sided low back pain, with sciatica presence unspecified  Abnormal posture     Problem List Patient Active Problem List   Diagnosis Date Noted  . Overactive bladder 10/13/2013  . Postoperative anemia due to acute blood loss 08/19/2013  . Obese 08/19/2013  . S/P left THA, AA 08/18/2013    Evelene Croon, PTA 05/02/2016, 12:06 PM  [facility] [address] [place], [place], [address] Phone: [phone]   Fax:  [phone]  Name: [place] Chung MRN: [id number] Date of Birth: 11-06-1955

## 2016-05-07 ENCOUNTER — Encounter: Payer: Self-pay | Admitting: Physical Therapy

## 2016-05-07 ENCOUNTER — Ambulatory Visit: Payer: BLUE CROSS/BLUE SHIELD | Admitting: Physical Therapy

## 2016-05-07 DIAGNOSIS — R293 Abnormal posture: Secondary | ICD-10-CM

## 2016-05-07 DIAGNOSIS — M545 Low back pain: Secondary | ICD-10-CM | POA: Diagnosis not present

## 2016-05-07 NOTE — Therapy (Signed)
[facility] [address] [place], [place], [address] Phone: [phone]   Fax:  [phone]  Physical Therapy Treatment  Patient Details  Name: [place] Chung MRN: [id number] Date of Birth: 16-Sep-1955 Referring Provider: Kirby Crigler MD.  Encounter Date: 05/07/2016      PT End of Session - 05/07/16 1145    Visit Number 10   Number of Visits 16   Date for PT Re-Evaluation 06/02/16   PT Start Time 1117   PT Stop Time 1201   PT Time Calculation (min) 44 min   Activity Tolerance Patient tolerated treatment well   Behavior During Therapy [facility] for tasks assessed/performed      Past Medical History:  Diagnosis Date  . Arthritis   . H/O hiatal hernia   . Hypertension     Past Surgical History:  Procedure Laterality Date  . TOTAL HIP ARTHROPLASTY Left 08/18/2013   Procedure: LEFT TOTAL HIP ARTHROPLASTY ANTERIOR APPROACH;  Surgeon: Shelda Pal, MD;  Location: [facility];  Service: Orthopedics;  Laterality: Left;  . TUBAL LIGATION      There were no vitals filed for this visit.      Subjective Assessment - 05/07/16 1129    Subjective Patient reported a lot of [place] [employer] this morning and pain in hip has increased the past few days after a lot of activity standing and house work   Pertinent History Left total hip replacement.   Limitations Standing;Walking   How long can you stand comfortably? 30 minutes (until leg gets weak)   How long can you walk comfortably? 45 min (until leg gets week)   Patient Stated Goals I want to get out of pain and do things like I did before surgery.   Currently in Pain? Yes   Pain Score 3    Pain Location Back   Pain Orientation Lower;Right   Pain Descriptors / Indicators Discomfort   Pain Type Acute pain   Pain Onset More than a month ago   Pain Frequency Intermittent   Aggravating Factors  prolong walking   Pain Relieving Factors at rest                         [facility] Adult  PT Treatment/Exercise - 05/07/16 0001      Lumbar Exercises: Aerobic   Stationary Bike NuStep L5 x15 min core focus     Lumbar Exercises: Standing   Row Strengthening;Both;20 reps   Row Limitations pink XTS   Shoulder Extension Strengthening;Both;20 reps;Theraband   Shoulder Extension Limitations pink XTS   Other Standing Lumbar Exercises B standing hip flexion and abduction x20 reps each     Moist Heat Therapy   Number Minutes Moist Heat 15 Minutes   Moist Heat Location Lumbar Spine     Electrical Stimulation   Electrical Stimulation Location R gluteals/low back   Electrical Stimulation Action premod   Electrical Stimulation Parameters 80-150hz x49min   Electrical Stimulation Goals Pain                     PT Long Term Goals - 05/07/16 1145      PT LONG TERM GOAL #1   Title Independent with a HEP.   Time 8   Period Weeks   Status Achieved     PT LONG TERM GOAL #2   Title Stand 20 minutes with pain not > 3/10.   Baseline able to stand 45 min with <3/10 pain  then fatigues   Time 8   Period Weeks   Status Achieved     PT LONG TERM GOAL #3   Title Walk her normal distance wiht pain not > 3/10.   Baseline 6/10 at 0.5 mile   Time 8   Period Weeks   Status On-going  8-9/10 reported 05/07/16     PT LONG TERM GOAL #4   Title Eliminate right LE symptoms.   Time 8   Period Weeks   Status On-going  symptoms in right LE is 30% better 05/07/16     PT LONG TERM GOAL #5   Title Perform ADL's with pain not > 3/10.   Baseline 4/10 as of 05/01/16   Time 8   Period Weeks   Status On-going  8/10 with ADL's 05/07/16               Plan - 05/07/16 1149    Clinical Impression Statement Patient tolerated treatment well today. Patient reported doing HEP 2x a week yet will try to do more. Patient has more pain with prolong walking or ADL's up to 8/10 at times. Patient feels 30% improvement with right LE symptoms. Patient  progressing toward goals yet ongoing due  to pain deficts.    Rehab Potential Good   PT Frequency 2x / week   PT Duration 8 weeks   PT Treatment/Interventions ADLs/Self Care Home Management;Cryotherapy;Electrical Stimulation;Moist Heat;Ultrasound;Therapeutic activities;Therapeutic exercise;Patient/family education;Manual techniques;Dry needling   PT Next Visit Plan Continue with glut med strengthening; DN prn; continue with maual therapy and stretching for R piriformis and ITB; modalities prn   Consulted and Agree with Plan of Care Patient      Patient will benefit from skilled therapeutic intervention in order to improve the following deficits and impairments:  Pain, Decreased activity tolerance, Decreased range of motion, Postural dysfunction  Visit Diagnosis: Acute right-sided low back pain, with sciatica presence unspecified  Abnormal posture     Problem List Patient Active Problem List   Diagnosis Date Noted  . Overactive bladder 10/13/2013  . Postoperative anemia due to acute blood loss 08/19/2013  . Obese 08/19/2013  . S/P left THA, AA 08/18/2013    Anai Lipson P, PTA 05/07/2016, 12:02 PM  [facility] [address] [place], [place], [address] Phone: [phone]   Fax:  [phone]  Name: Lynnlee Revels MRN: [id number] Date of Birth: 10-21-1955

## 2016-05-09 ENCOUNTER — Ambulatory Visit: Payer: BLUE CROSS/BLUE SHIELD | Attending: Internal Medicine | Admitting: Physical Therapy

## 2016-05-09 DIAGNOSIS — R293 Abnormal posture: Secondary | ICD-10-CM | POA: Diagnosis present

## 2016-05-09 DIAGNOSIS — M545 Low back pain: Secondary | ICD-10-CM | POA: Insufficient documentation

## 2016-05-09 NOTE — Therapy (Signed)
[facility] [address] [place], [place], [address] Phone: [phone]   Fax:  [phone]  Physical Therapy Treatment  Patient Details  Name: Sharon Chung MRN: [id number] Date of Birth: 06/01/1955 Referring Provider: Kirby Crigler MD.  Encounter Date: 05/09/2016      PT End of Session - 05/09/16 1307    Visit Number 11   Number of Visits 16   Date for PT Re-Evaluation 06/02/16   PT Start Time 1115   PT Stop Time 1202   PT Time Calculation (min) 47 min   Activity Tolerance Patient tolerated treatment well   Behavior During Therapy [facility] for tasks assessed/performed      Past Medical History:  Diagnosis Date  . Arthritis   . H/O hiatal hernia   . Hypertension     Past Surgical History:  Procedure Laterality Date  . TOTAL HIP ARTHROPLASTY Left 08/18/2013   Procedure: LEFT TOTAL HIP ARTHROPLASTY ANTERIOR APPROACH;  Surgeon: Shelda Pal, MD;  Location: [facility];  Service: Orthopedics;  Laterality: Left;  . TUBAL LIGATION      There were no vitals filed for this visit.      Subjective Assessment - 05/09/16 1310    Subjective I'm at least 40% better overall.  CC is in area of right upper mid gluteals.                         [facility] Adult PT Treatment/Exercise - 05/09/16 0001      Exercises   Exercises Knee/Hip     Lumbar Exercises: Aerobic   Stationary Bike Level 6 x 15 minutes.     Modalities   Modalities Electrical Stimulation;Moist Heat;Ultrasound     Moist Heat Therapy   Number Minutes Moist Heat 15 Minutes   Moist Heat Location --  Right upper/mid gluts.     Programme researcher, broadcasting/film/video Location Right upper/mid gluts.   Electrical Stimulation Action Constant pre-mod e' stim    Electrical Stimulation Parameters 80-150 hz x 15 minutes.   Electrical Stimulation Goals Tone;Pain     Ultrasound   Ultrasound Location Patient in partial left sdly position with 2 pillows between  knees::   Perform Combo E'stim/U/S at 1.50 W/CM2 x 10 minutes to right upper/mid gluteal region.   Ultrasound Goals Pain                     PT Long Term Goals - 05/07/16 1145      PT LONG TERM GOAL #1   Title Independent with a HEP.   Time 8   Period Weeks   Status Achieved     PT LONG TERM GOAL #2   Title Stand 20 minutes with pain not > 3/10.   Baseline able to stand 45 min with <3/10 pain then fatigues   Time 8   Period Weeks   Status Achieved     PT LONG TERM GOAL #3   Title Walk her normal distance wiht pain not > 3/10.   Baseline 6/10 at 0.5 mile   Time 8   Period Weeks   Status On-going  8-9/10 reported 05/07/16     PT LONG TERM GOAL #4   Title Eliminate right LE symptoms.   Time 8   Period Weeks   Status On-going  symptoms in right LE is 30% better 05/07/16     PT LONG TERM GOAL #5   Title Perform ADL's with pain  not > 3/10.   Baseline 4/10 as of 05/01/16   Time 8   Period Weeks   Status On-going  8/10 with ADL's 05/07/16               Plan - 05/09/16 1316    Clinical Impression Statement Patient responding well to treatments with a subjective improvement rating of 40%+ today.  CC was that of right upper/mid gluteal pain.      Patient will benefit from skilled therapeutic intervention in order to improve the following deficits and impairments:     Visit Diagnosis: Acute right-sided low back pain, with sciatica presence unspecified  Abnormal posture     Problem List Patient Active Problem List   Diagnosis Date Noted  . Overactive bladder 10/13/2013  . Postoperative anemia due to acute blood loss 08/19/2013  . Obese 08/19/2013  . S/P left THA, AA 08/18/2013    Ernest Orr, [place] MPT 05/09/2016, 1:30 PM  [facility] [address] [place], [place], [address] Phone: [phone]   Fax:  [phone]  Name: Sharon Chung MRN: [id number] Date of Birth: [birth year]

## 2016-05-14 ENCOUNTER — Ambulatory Visit: Payer: BLUE CROSS/BLUE SHIELD | Admitting: Physical Therapy

## 2016-05-14 DIAGNOSIS — M545 Low back pain: Secondary | ICD-10-CM

## 2016-05-14 NOTE — Therapy (Signed)
[facility] [address] [place], [place], [address] Phone: [phone]   Fax:  [phone]  Physical Therapy Treatment  Patient Details  Name: Sharon Chung MRN: [id number] Date of Birth: 05/25/1955 Referring Provider: Kirby CriglerXaje Xasanaj MD.  Encounter Date: 05/14/2016      PT End of Session - 05/14/16 1121    Visit Number 12   Number of Visits 16   Date for PT Re-Evaluation 06/02/16   PT Start Time 1116   PT Stop Time 1218   PT Time Calculation (min) 62 min   Activity Tolerance Patient tolerated treatment well   Behavior [facility] Therapy [facility] for tasks assessed/performed      Past Medical History:  Diagnosis Date  . Arthritis   . H/O hiatal hernia   . Hypertension     Past Surgical History:  Procedure Laterality Date  . TOTAL HIP ARTHROPLASTY Left 08/18/2013   Procedure: LEFT TOTAL HIP ARTHROPLASTY ANTERIOR APPROACH;  Surgeon: Shelda PalMatthew D Olin, MD;  Location: [facility];  Service: Orthopedics;  Laterality: Left;  . TUBAL LIGATION      There were no vitals filed for this visit.      Subjective Assessment - 05/14/16 1121    Subjective Patient reports frustration that she is not making better progress.   Pertinent History Left total hip replacement.   Limitations Standing;Walking   How long can you stand comfortably? 30 minutes (until leg gets weak)   How long can you walk comfortably? 45 min (until leg gets week)   Patient Stated Goals I want to get out of pain and do things like I did before surgery.   Currently in Pain? Yes   Pain Score 3    Pain Location Back   Pain Orientation Right;Lower   Pain Descriptors / Indicators Discomfort                         [facility] Adult PT Treatment/Exercise - 05/14/16 0001      Self-Care   Self-Care Other Self-Care Comments   Other Self-Care Comments  MFR with tennis ball and rolling pin     Lumbar Exercises: Stretches   ITB Stretch 2 reps;60 seconds   ITB Stretch Limitations  not felt with strap; did standing     Lumbar Exercises: Aerobic   Stationary Bike Level 6 x 15 minutes.     Modalities   Modalities Doctor, general practicelectrical Stimulation     Electrical Stimulation   Electrical Stimulation Location R gluteals   Electrical Stimulation Action constant premod    Electrical Stimulation Parameters 80-150 Hz x 15 min   Electrical Stimulation Goals Tone;Pain     Manual Therapy   Manual Therapy Soft tissue mobilization;Myofascial release   Soft tissue mobilization to R gluteals and lateral thigh   Myofascial Release TPR and J stroke to gluteals and piriformis; strumming to ITB          Trigger Point Dry Needling - 05/14/16 1214    Consent Given? Yes   Education Handout Provided No   Muscles Treated Upper Body Longissimus;Quadratus Lumborum  R   Muscles Treated Lower Body Gluteus minimus;Gluteus maximus;Piriformis;Quadriceps  R and glut med   Longissimus Response Twitch response elicited;Palpable increased muscle length  no twitch with QL   Gluteus Maximus Response Twitch response elicited;Palpable increased muscle length   Gluteus Minimus Response Twitch response elicited;Palpable increased muscle length  ++LTR   Piriformis Response Twitch response elicited;Palpable increased muscle length   Quadriceps Response  Twitch response elicited;Palpable increased muscle length  lateralis              PT Education - 05/14/16 1221    Education provided Yes   Education Details HEP and self care   Person(s) Educated Patient   Methods Explanation;Demonstration;Handout;Tactile cues;Verbal cues   Comprehension Verbalized understanding;Returned demonstration             PT Long Term Goals - 05/07/16 1145      PT LONG TERM GOAL #1   Title Independent with a HEP.   Time 8   Period Weeks   Status Achieved     PT LONG TERM GOAL #2   Title Stand 20 minutes with pain not > 3/10.   Baseline able to stand 45 min with <3/10 pain then fatigues   Time 8   Period  Weeks   Status Achieved     PT LONG TERM GOAL #3   Title Walk her normal distance wiht pain not > 3/10.   Baseline 6/10 at 0.5 mile   Time 8   Period Weeks   Status On-going  8-9/10 reported 05/07/16     PT LONG TERM GOAL #4   Title Eliminate right LE symptoms.   Time 8   Period Weeks   Status On-going  symptoms in right LE is 30% better 05/07/16     PT LONG TERM GOAL #5   Title Perform ADL's with pain not > 3/10.   Baseline 4/10 as of 05/01/16   Time 8   Period Weeks   Status On-going  8/10 with ADL's 05/07/16               Plan - 05/14/16 1222    Clinical Impression Statement Patient demos marked tightness with multiple trigger points in R gluteals, piriformis and lateral quads. She responded well to DN with ++ twitch response in glut minimis. She states she is not doing stretching at home, but will be more diligent. She also stands and walks with decreased WB on LLE likely due to habit from THR years ago. Patient encouraged to shift wt to left to take strain of R lateral hip. No heat following treatment due to hydrocollator out of order.   PT Treatment/Interventions ADLs/Self Care Home Management;Cryotherapy;Electrical Stimulation;Moist Heat;Ultrasound;Therapeutic activities;Therapeutic exercise;Patient/family education;Manual techniques;Dry needling   PT Next Visit Plan Continue with glut med strengthening; DN prn; continue with manual therapy and stretching for R gluteal, piriformis and ITB; modalities prn   PT Home Exercise Plan piriformis and ITB stretches, SKTC   Consulted and Agree with Plan of Care Patient      Patient will benefit from skilled therapeutic intervention in order to improve the following deficits and impairments:  Pain, Decreased activity tolerance, Decreased range of motion, Postural dysfunction  Visit Diagnosis: Acute right-sided low back pain, with sciatica presence unspecified     Problem List Patient Active Problem List   Diagnosis Date  Noted  . Overactive bladder 10/13/2013  . Postoperative anemia due to acute blood loss 08/19/2013  . Obese 08/19/2013  . S/P left THA, AA 08/18/2013    Solon Palm PT 05/14/2016, 12:28 PM  [facility] [address] [place], [place], [address] Phone: [phone]   Fax:  [phone]  Name: Sharon Chung MRN: [id number] Date of Birth: 1955-09-24

## 2016-05-14 NOTE — Patient Instructions (Signed)
Iliotibial Band Stretch   Stand with right hip away from wall. Cross other leg in front and use it and arm for support. Lean toward wall until a stretch is felt on outside of hip near wall. Keep that leg straight. Hold _30 or more seconds. Repeat _3___ times. Do _2-3___ sessions per day.   ROLLING PIN: Use rolling pin to roll your thigh and side of your thigh. 1-2 x/day.  TENNIS BALL: Use a tennis ball to release trigger points in your buttock muscles. You can sit on it too if you like.  WEIGHT SHIFT ONTO LEFT LEG!  Solon PalmJulie Jenayah Antu, PT 05/14/16 12:07 PM [facility] [address] [place], [place], [address] Phone: [phone]   Fax:  [phone]

## 2016-05-16 ENCOUNTER — Encounter: Payer: BLUE CROSS/BLUE SHIELD | Admitting: Physical Therapy

## 2016-05-17 ENCOUNTER — Ambulatory Visit: Payer: BLUE CROSS/BLUE SHIELD | Admitting: *Deleted

## 2016-05-17 DIAGNOSIS — R293 Abnormal posture: Secondary | ICD-10-CM

## 2016-05-17 DIAGNOSIS — M545 Low back pain: Secondary | ICD-10-CM

## 2016-05-17 NOTE — Therapy (Signed)
[facility] [address] [place], [place], [address] Phone: [phone]   Fax:  [phone]  Physical Therapy Treatment  Patient Details  Name: Sharon Chung MRN: [id number] Date of Birth: January 08, 1956 Referring Provider: Kirby Crigler MD.  Encounter Date: 05/17/2016      PT End of Session - 05/17/16 0902    Visit Number 13   Number of Visits 16   Date for PT Re-Evaluation 06/02/16   PT Start Time 0900   PT Stop Time 0951   PT Time Calculation (min) 51 min      Past Medical History:  Diagnosis Date  . Arthritis   . H/O hiatal hernia   . Hypertension     Past Surgical History:  Procedure Laterality Date  . TOTAL HIP ARTHROPLASTY Left 08/18/2013   Procedure: LEFT TOTAL HIP ARTHROPLASTY ANTERIOR APPROACH;  Surgeon: Shelda Pal, MD;  Location: [facility];  Service: Orthopedics;  Laterality: Left;  . TUBAL LIGATION      There were no vitals filed for this visit.      Subjective Assessment - 05/17/16 0902    Subjective Did great after last Rx. Needling really helped   Pertinent History Left total hip replacement.   Limitations Standing;Walking   How long can you stand comfortably? 30 minutes (until leg gets weak)   How long can you walk comfortably? 45 min (until leg gets week)   Patient Stated Goals I want to get out of pain and do things like I did before surgery.   Currently in Pain? Yes   Pain Score 2    Pain Orientation Right;Lower   Pain Descriptors / Indicators Discomfort   Pain Onset More than a month ago                         [facility] Adult PT Treatment/Exercise - 05/17/16 0001      Exercises   Exercises Knee/Hip     Lumbar Exercises: Stretches   ITB Stretch 2 reps;60 seconds   Piriformis Stretch 3 reps;30 seconds     Lumbar Exercises: Aerobic   Stationary Bike Level 6 x 15 minutes.     Lumbar Exercises: Supine   Ab Set 20 reps;5 seconds   Straight Leg Raise 20 reps  Bilateral x 20 each      Modalities   Modalities Electrical Stimulation     Moist Heat Therapy   Number Minutes Moist Heat 15 Minutes   Moist Heat Location Lumbar Spine     Electrical Stimulation   Electrical Stimulation Location RT glutes piriformis premod x 15 mins 80-150hz    Electrical Stimulation Goals Tone;Pain     Manual Therapy   Manual Therapy Passive ROM   Soft tissue mobilization --   Passive ROM Passive piriformis stretch                     PT Long Term Goals - 05/07/16 1145      PT LONG TERM GOAL #1   Title Independent with a HEP.   Time 8   Period Weeks   Status Achieved     PT LONG TERM GOAL #2   Title Stand 20 minutes with pain not > 3/10.   Baseline able to stand 45 min with <3/10 pain then fatigues   Time 8   Period Weeks   Status Achieved     PT LONG TERM GOAL #3   Title Walk her normal distance wiht  pain not > 3/10.   Baseline 6/10 at 0.5 mile   Time 8   Period Weeks   Status On-going  8-9/10 reported 05/07/16     PT LONG TERM GOAL #4   Title Eliminate right LE symptoms.   Time 8   Period Weeks   Status On-going  symptoms in right LE is 30% better 05/07/16     PT LONG TERM GOAL #5   Title Perform ADL's with pain not > 3/10.   Baseline 4/10 as of 05/01/16   Time 8   Period Weeks   Status On-going  8/10 with ADL's 05/07/16               Plan - 05/17/16 1011    Clinical Impression Statement Pt arrived feeling good after last Rx and didn't want to do to much today. She was able to perform some exs and hip stretches and did well. Normal response to Estim.   Rehab Potential Good   PT Frequency 2x / week   PT Duration 8 weeks   PT Treatment/Interventions ADLs/Self Care Home Management;Cryotherapy;Electrical Stimulation;Moist Heat;Ultrasound;Therapeutic activities;Therapeutic exercise;Patient/family education;Manual techniques;Dry needling   PT Next Visit Plan Continue with glut med strengthening; DN prn; continue with manual therapy and  stretching for R gluteal, piriformis and ITB; modalities prn   PT Home Exercise Plan piriformis and ITB stretches, SKTC   Consulted and Agree with Plan of Care Patient      Patient will benefit from skilled therapeutic intervention in order to improve the following deficits and impairments:  Pain, Decreased activity tolerance, Decreased range of motion, Postural dysfunction  Visit Diagnosis: Acute right-sided low back pain, with sciatica presence unspecified  Abnormal posture     Problem List Patient Active Problem List   Diagnosis Date Noted  . Overactive bladder 10/13/2013  . Postoperative anemia due to acute blood loss 08/19/2013  . Obese 08/19/2013  . S/P left THA, AA 08/18/2013    RAMSEUR,CHRIS, PTA 05/17/2016, 10:34 AM  [facility] [address] [place], [place], [address] Phone: [phone]   Fax:  [phone]  Name: Sharon Chung MRN: [id number] Date of Birth: 09/21/1955

## 2016-05-22 ENCOUNTER — Ambulatory Visit: Payer: BLUE CROSS/BLUE SHIELD | Admitting: Physical Therapy

## 2016-05-22 DIAGNOSIS — M545 Low back pain: Secondary | ICD-10-CM

## 2016-05-22 NOTE — Patient Instructions (Signed)
Back Hyperextension: Using Arms    Lying face down with arms bent, inhale. Then while exhaling, straighten arms. Hold _2-3___ seconds. Slowly return to starting position. Repeat _10___ times per set. Do _1-3___ sets per session. Do __4-5__ sessions per day.   Solon PalmJulie Cola Gane, PT 05/22/16 10:29 AM [facility] [address] [place], [place], [address] Phone: [phone]   Fax:  [phone]

## 2016-05-22 NOTE — Therapy (Signed)
[facility] [address] [place], [place], [address] Phone: [phone]   Fax:  [phone]  Physical Therapy Treatment  Patient Details  Name: Sharon Chung MRN: [id number] Date of Birth: [birth year] Referring Provider: Kirby Crigler MD.  Encounter Date: 05/22/2016      PT End of Session - 05/22/16 0948    Visit Number 14   Number of Visits 16   Date for PT Re-Evaluation 06/02/16   PT Start Time 0946   PT Stop Time 1030   PT Time Calculation (min) 44 min   Activity Tolerance Patient tolerated treatment well   Behavior During Therapy [facility] for tasks assessed/performed      Past Medical History:  Diagnosis Date  . Arthritis   . H/O hiatal hernia   . Hypertension     Past Surgical History:  Procedure Laterality Date  . TOTAL HIP ARTHROPLASTY Left 08/18/2013   Procedure: LEFT TOTAL HIP ARTHROPLASTY ANTERIOR APPROACH;  Surgeon: Shelda Pal, MD;  Location: [facility];  Service: Orthopedics;  Laterality: Left;  . TUBAL LIGATION      There were no vitals filed for this visit.      Subjective Assessment - 05/22/16 0948    Subjective Patient reports minimal pain today and only with walking. She can lie on her stomach for 5-10 min when her back is hurting and she's good to go again. She is doing really well since the DN. She was able to push mow her yard for the first time in years without any pain.   Pertinent History Left total hip replacement.   Limitations Standing;Walking   How long can you stand comfortably? 30 minutes (until leg gets weak)   How long can you walk comfortably? 30 min (until leg gets week)   Patient Stated Goals I want to get out of pain and do things like I did before surgery.   Currently in Pain? Yes   Pain Score 2    Pain Location Back   Pain Orientation Right;Lower                         [facility] Adult PT Treatment/Exercise - 05/22/16 0001      Ambulation/Gait   Ambulation/Gait Yes   Ambulation/Gait Assistance 7: Independent   Ambulation Distance (Feet) 100 Feet   Assistive device None   Gait Pattern Decreased stance time - right;Decreased step length - left   Ambulation Surface Level   Gait Comments worked on exaggerated step and stride length B to normalize gait.     Therapeutic Activites    Therapeutic Activities ADL's   ADL's worked on getting in/out of car from low mat table     Lumbar Exercises: Aerobic   Stationary Bike Level 6 x 15 minutes.     Lumbar Exercises: Standing   Other Standing Lumbar Exercises lumbar extension x 10   patient reports pain in R hip after     Lumbar Exercises: Prone   Other Prone Lumbar Exercises prone lying abolishes pain; prone press ups 2x 10     Knee/Hip Exercises: Standing   Forward Step Up Right;3 sets;10 reps;Hand Hold: 2;Step Height: 6"                PT Education - 05/22/16 1044    Education provided Yes   Education Details HEP; ADL modification (car)   Person(s) Educated Patient   Methods Explanation;Demonstration;Tactile cues;Verbal cues;Handout   Comprehension Verbalized understanding;Returned demonstration  PT Long Term Goals - 05/07/16 1145      PT LONG TERM GOAL #1   Title Independent with a HEP.   Time 8   Period Weeks   Status Achieved     PT LONG TERM GOAL #2   Title Stand 20 minutes with pain not > 3/10.   Baseline able to stand 45 min with <3/10 pain then fatigues   Time 8   Period Weeks   Status Achieved     PT LONG TERM GOAL #3   Title Walk her normal distance wiht pain not > 3/10.   Baseline 6/10 at 0.5 mile   Time 8   Period Weeks   Status On-going  8-9/10 reported 05/07/16     PT LONG TERM GOAL #4   Title Eliminate right LE symptoms.   Time 8   Period Weeks   Status On-going  symptoms in right LE is 30% better 05/07/16     PT LONG TERM GOAL #5   Title Perform ADL's with pain not > 3/10.   Baseline 4/10 as of 05/01/16   Time 8   Period Weeks   Status  On-going  8/10 with ADL's 05/07/16               Plan - 05/22/16 1034    Clinical Impression Statement Patient presented today with c/o pain only with amb, but it seems to be after she's been sitting a while. She presents with signs and sx consistent with a lumbar disc. She is able to abolish hip and leg pain with prone lying and/or prone press ups. Pain centralized to low back after press ups. Patient to begin a trial of regular prone press ups at home as part of HEP. We also focused on her normalizing gait as she favors her RLE. She c/o of difficulty getting in/out of car due to LE weakness so this was addressed today as well.     PT Treatment/Interventions ADLs/Self Care Home Management;Cryotherapy;Electrical Stimulation;Moist Heat;Ultrasound;Therapeutic activities;Therapeutic exercise;Patient/family education;Manual techniques;Dry needling   PT Next Visit Plan Assess response to prone press ups; continue RLE functional strengthening. R HF release PRN. Modalities and DN as needed.   PT Home Exercise Plan prone press ups piriformis and ITB stretches, SKTC      Patient will benefit from skilled therapeutic intervention in order to improve the following deficits and impairments:  Pain, Decreased activity tolerance, Decreased range of motion, Postural dysfunction  Visit Diagnosis: Acute right-sided low back pain, with sciatica presence unspecified     Problem List Patient Active Problem List   Diagnosis Date Noted  . Overactive bladder 10/13/2013  . Postoperative anemia due to acute blood loss 08/19/2013  . Obese 08/19/2013  . S/P left THA, AA 08/18/2013   Solon PalmJulie Sasuke Yaffe PT 05/22/2016, 10:55 AM  [facility] [address] [place], [place], [address] Phone: [phone]   Fax:  [phone]  Name: Sharon SalinaHilda Chung MRN: [id number] Date of Birth: 09/19/1955

## 2016-05-24 ENCOUNTER — Ambulatory Visit: Payer: BLUE CROSS/BLUE SHIELD | Admitting: Physical Therapy

## 2016-05-24 ENCOUNTER — Encounter: Payer: Self-pay | Admitting: Physical Therapy

## 2016-05-24 DIAGNOSIS — M545 Low back pain: Secondary | ICD-10-CM

## 2016-05-24 DIAGNOSIS — R293 Abnormal posture: Secondary | ICD-10-CM

## 2016-05-24 NOTE — Therapy (Addendum)
[facility] [place], [place], [address] Phone: [phone]   Fax:  [phone]  Physical Therapy Treatment  Patient Details  Name: Sharon Chung MRN: [id number] Date of Birth: 1955-05-03 Referring Provider: Hilbert Odor MD.  Encounter Date: 05/24/2016      PT End of Session - 05/24/16 0944    Visit Number 15   Number of Visits 16   Date for PT Re-Evaluation 06/02/16   PT Start Time 0907   PT Stop Time 1000   PT Time Calculation (min) 53 min   Activity Tolerance Patient tolerated treatment well   Behavior During Therapy [facility] for tasks assessed/performed      Past Medical History:  Diagnosis Date  . Arthritis   . H/O hiatal hernia   . Hypertension     Past Surgical History:  Procedure Laterality Date  . TOTAL HIP ARTHROPLASTY Left 08/18/2013   Procedure: LEFT TOTAL HIP ARTHROPLASTY ANTERIOR APPROACH;  Surgeon: Mauri Pole, MD;  Location: [facility];  Service: Orthopedics;  Laterality: Left;  . TUBAL LIGATION      There were no vitals filed for this visit.      Subjective Assessment - 05/24/16 0916    Subjective Patient reported increased pain may be due to weather and unable to position in bed without pain.   Pertinent History Left total hip replacement.   Limitations Standing;Walking   How long can you stand comfortably? 30 minutes (until leg gets weak)   How long can you walk comfortably? 30 min (until leg gets week)   Patient Stated Goals I want to get out of pain and do things like I did before surgery.   Currently in Pain? Yes   Pain Score 4    Pain Location Back   Pain Orientation Right;Lower   Pain Descriptors / Indicators Dull;Aching   Pain Type Acute pain   Pain Onset More than a month ago   Pain Frequency Intermittent   Aggravating Factors  increased activity   Pain Relieving Factors at rest and upright                         [facility] Adult PT Treatment/Exercise - 05/24/16 0001       Self-Care   Self-Care Posture   Other Self-Care Comments  positioning for sleeping to ease pain     Lumbar Exercises: Aerobic   Stationary Bike Level 6 x 15 minutes UE/LE, monitored for progression, cues for posture     Moist Heat Therapy   Number Minutes Moist Heat 15 Minutes   Moist Heat Location Lumbar Spine     Electrical Stimulation   Electrical Stimulation Location RT glutes piriformis premod x 15 mins 80-'150hz'$    Electrical Stimulation Goals Tone;Pain     Manual Therapy   Manual Therapy Soft tissue mobilization   Myofascial Release TPR and STW to gluteals and piriformis                     PT Long Term Goals - 05/07/16 1145      PT LONG TERM GOAL #1   Title Independent with a HEP.   Time 8   Period Weeks   Status Achieved     PT LONG TERM GOAL #2   Title Stand 20 minutes with pain not > 3/10.   Baseline able to stand 45 min with <3/10 pain then fatigues   Time 8   Period Weeks  Status Achieved     PT LONG TERM GOAL #3   Title Walk her normal distance wiht pain not > 3/10.   Baseline 6/10 at 0.5 mile   Time 8   Period Weeks   Status On-going  8-9/10 reported 05/07/16     PT LONG TERM GOAL #4   Title Eliminate right LE symptoms.   Time 8   Period Weeks   Status On-going  symptoms in right LE is 30% better 05/07/16     PT LONG TERM GOAL #5   Title Perform ADL's with pain not > 3/10.   Baseline 4/10 as of 05/01/16   Time 8   Period Weeks   Status On-going  8/10 with ADL's 05/07/16               Plan - 05/24/16 0948    Clinical Impression Statement Patient tolerated treatment well today. Patient has reported increased pain in right low back and hip and unable to sleep. Today focused on posture/core and positioning for comfort. Patient reported not doing exercises daily. Educated patient on daily HEP to increase functional independence. Patient understands importance of core activation/strengthening and stretches and positioning.  Remaining goals ongoing due to pain deficits.   Rehab Potential Good   PT Frequency 2x / week   PT Duration 8 weeks   PT Treatment/Interventions ADLs/Self Care Home Management;Cryotherapy;Electrical Stimulation;Moist Heat;Ultrasound;Therapeutic activities;Therapeutic exercise;Patient/family education;Manual techniques;Dry needling   PT Next Visit Plan cont with POC   Consulted and Agree with Plan of Care Patient      Patient will benefit from skilled therapeutic intervention in order to improve the following deficits and impairments:  Pain, Decreased activity tolerance, Decreased range of motion, Postural dysfunction  Visit Diagnosis: Acute right-sided low back pain, with sciatica presence unspecified  Abnormal posture     Problem List Patient Active Problem List   Diagnosis Date Noted  . Overactive bladder 10/13/2013  . Postoperative anemia due to acute blood loss 08/19/2013  . Obese 08/19/2013  . S/P left THA, AA 08/18/2013    Alesandro Stueve P, PTA 05/24/2016, 10:20 AM  [facility] [place], [place], [address] Phone: [phone]   Fax:  [phone]  Name: Sharon Chung MRN: [id number] Date of Birth: 06-20-1955  PHYSICAL THERAPY DISCHARGE SUMMARY  Visits from Start of Care: 15.  Current functional level related to goals / functional outcomes: See above.   Remaining deficits: See goal section.   Education / Equipment: HEP. Plan: Patient agrees to discharge.  Patient goals were partially met. Patient is being discharged due to lack of progress.  ?????         Mali Applegate MPT

## 2016-10-30 ENCOUNTER — Encounter (HOSPITAL_COMMUNITY): Payer: Self-pay | Admitting: Emergency Medicine

## 2016-10-30 ENCOUNTER — Emergency Department (HOSPITAL_COMMUNITY): Payer: BLUE CROSS/BLUE SHIELD

## 2016-10-30 ENCOUNTER — Emergency Department (HOSPITAL_COMMUNITY)
Admission: EM | Admit: 2016-10-30 | Discharge: 2016-10-30 | Disposition: A | Discharge status: Home / Self Care | Payer: BLUE CROSS/BLUE SHIELD | Attending: Emergency Medicine | Admitting: Emergency Medicine

## 2016-10-30 DIAGNOSIS — M25552 Pain in left hip: Secondary | ICD-10-CM | POA: Diagnosis not present

## 2016-10-30 DIAGNOSIS — Z79899 Other long term (current) drug therapy: Secondary | ICD-10-CM | POA: Insufficient documentation

## 2016-10-30 DIAGNOSIS — Z96642 Presence of left artificial hip joint: Secondary | ICD-10-CM | POA: Insufficient documentation

## 2016-10-30 DIAGNOSIS — K047 Periapical abscess without sinus: Secondary | ICD-10-CM | POA: Insufficient documentation

## 2016-10-30 DIAGNOSIS — I10 Essential (primary) hypertension: Secondary | ICD-10-CM | POA: Diagnosis not present

## 2016-10-30 DIAGNOSIS — R21 Rash and other nonspecific skin eruption: Secondary | ICD-10-CM | POA: Diagnosis present

## 2016-10-30 DIAGNOSIS — B029 Zoster without complications: Secondary | ICD-10-CM | POA: Diagnosis not present

## 2016-10-30 MED ORDER — ONDANSETRON 4 MG PO TBDP
4.0000 mg | ORAL_TABLET | Freq: Once | ORAL | Status: AC
  Administered 2016-10-30: 4 mg via ORAL
  Filled 2016-10-30: qty 1

## 2016-10-30 MED ORDER — HYDROMORPHONE HCL 1 MG/ML IJ SOLN
1.0000 mg | Freq: Once | INTRAMUSCULAR | Status: AC
  Administered 2016-10-30: 1 mg via INTRAMUSCULAR
  Filled 2016-10-30: qty 1

## 2016-10-30 MED ORDER — ACYCLOVIR 800 MG PO TABS: 800.0000 mg | ORAL_TABLET | Freq: Every day | ORAL | 0 refills | Status: DC

## 2016-10-30 MED ORDER — OXYCODONE-ACETAMINOPHEN 5-325 MG PO TABS: 1.0000 | ORAL_TABLET | ORAL | 0 refills | Status: DC | PRN

## 2016-10-30 MED ORDER — CLINDAMYCIN HCL 300 MG PO CAPS: 300.0000 mg | ORAL_CAPSULE | Freq: Four times a day (QID) | ORAL | 0 refills | Status: AC

## 2016-10-30 NOTE — ED Triage Notes (Addendum)
Per patient started having fever of on Sunday with pain in bilateral legs and hips. And  On Sunday. Patient called PCP and was told to take tylenol which brought her fever down. Patient states she had to take oxycodone every 3 hours due to pain. Patient states pain has been better today and no fever but now has rash on left hip. Denies any itching. Area red, warm to touch, and tender to touch. Denies any nausea or vomiting.

## 2016-10-30 NOTE — ED Provider Notes (Signed)
[facility] EMERGENCY DEPARTMENT Provider Note   CSN: [id number] Arrival date & time: 10/30/16  1230     History   Chief Complaint Chief Complaint  Patient presents with  . Rash    HPI Sharon Chung is a 61 y.o. female with a past medical history significant for arthritis, hypertension and 3 years out from a left total hip arthroplasty presenting with a approximate 1 week history of lower back and buttock pain described as sharp and at times burning but also itching in character along with fevers this past weekend up to 102 which have responded to Tylenol and has been afebrile for the past 24 hours.  She reports suspicion of a dental abscess and is anticipating a dental extraction by her dentist soon.  She has not been on any antibiotics for this infection.  Her biggest concern today is to make sure her back and buttock pain is not related to a hip joint infection.  She denies pain in her hip joints rather across her buttock with movement and palpation.  Yesterday she woke with a warm red patch of skin across her left buttock.  She denies rash.  The site is tender with even light palpation.  She uses oxycodone as needed for orthopedic pain relief and has been taking every 3 hours for the past day for symptom relief.  The history is provided by the patient.    Past Medical History:  Diagnosis Date  . Arthritis   . H/O hiatal hernia   . Hypertension     Patient Active Problem List   Diagnosis Date Noted  . Overactive bladder 10/13/2013  . Postoperative anemia due to acute blood loss 08/19/2013  . Obese 08/19/2013  . S/P left THA, AA 08/18/2013    Past Surgical History:  Procedure Laterality Date  . TOTAL HIP ARTHROPLASTY Left 08/18/2013   Procedure: LEFT TOTAL HIP ARTHROPLASTY ANTERIOR APPROACH;  Surgeon: Shelda Pal, MD;  Location: [facility];  Service: Orthopedics;  Laterality: Left;  . TUBAL LIGATION      OB History    No data available       Home Medications     Prior to Admission medications   Medication Sig Start Date End Date Taking? Authorizing Provider  acyclovir (ZOVIRAX) 800 MG tablet Take 1 tablet (800 mg total) by mouth 5 (five) times daily. 10/30/16   Burgess Amor, PA-C  amLODipine (NORVASC) 5 MG tablet Take 5 mg by mouth every morning.    [provider]  bisoprolol-hydrochlorothiazide (ZIAC) 5-6.25 MG per tablet Take 1 tablet by mouth every evening.    [provider]  Cholecalciferol (VITAMIN D) 2000 UNITS CAPS Take 2,000 Units by mouth daily.    [provider]  Cholecalciferol (VITAMIN D3) 3000 UNITS TABS Take 1,000 Units by mouth. Take 1 tab daily    [provider]  clindamycin (CLEOCIN) 300 MG capsule Take 1 capsule (300 mg total) by mouth 4 (four) times daily. 10/30/16 11/06/16  Burgess Amor, PA-C  docusate sodium 100 MG CAPS Take 100 mg by mouth 2 (two) times daily. 08/19/13   Lanney Gins, PA-C  ferrous sulfate 325 (65 FE) MG tablet Take 1 tablet (325 mg total) by mouth 3 (three) times daily after meals. 08/19/13   Lanney Gins, PA-C  HYDROcodone-acetaminophen (LORTAB) 7.5-500 MG per tablet Take 1 tablet by mouth 2 (two) times daily.    [provider]  methocarbamol (ROBAXIN) 500 MG tablet Take 1 tablet (500 mg total) by mouth  every 6 (six) hours as needed for muscle spasms. 08/19/13   Lanney Gins, PA-C  mirabegron ER (MYRBETRIQ) 50 MG TB24 tablet Take 1 tablet (50 mg total) by mouth daily. 10/13/13   Lazaro Arms, MD  oxyCODONE-acetaminophen (PERCOCET/ROXICET) 5-325 MG tablet Take 1 tablet by mouth every 4 (four) hours as needed. 10/30/16   Burgess Amor, PA-C  polyethylene glycol (MIRALAX / GLYCOLAX) packet Take 17 g by mouth 2 (two) times daily. Patient not taking: Reported on 04/03/2016 08/19/13   Lanney Gins, PA-C  simvastatin (ZOCOR) 20 MG tablet Take 20 mg by mouth daily at 6 PM.    [provider]  sulfamethoxazole-trimethoprim (BACTRIM DS) 800-160 MG per tablet  Take 1 tablet by mouth 2 (two) times daily.    [provider]  traMADol (ULTRAM) 50 MG tablet Take by mouth 2 (two) times daily.    [provider]    Family History History reviewed. No pertinent family history.  Social History Social History  Substance Use Topics  . Smoking status: Never Smoker  . Smokeless tobacco: Never Used  . Alcohol use No     Allergies   Patient has no known allergies.   Review of Systems Review of Systems  Constitutional: Positive for fever. Negative for chills.  HENT: Positive for dental problem.   Respiratory: Negative for shortness of breath and wheezing.   Gastrointestinal: Negative for nausea and vomiting.  Musculoskeletal: Positive for arthralgias and back pain.  Skin: Positive for color change.  Neurological: Negative for numbness.     Physical Exam Updated Vital Signs BP (!) 141/60 (BP Location: Left Arm)   Pulse (!) 56   Temp 98 F (36.7 C) (Oral)   Resp 14   Ht 5\' 1"  (1.549 m)   Wt 103 kg (227 lb)   SpO2 96%   BMI 42.89 kg/m   Physical Exam  Constitutional: She is oriented to person, place, and time. She appears well-developed and well-nourished. No distress.  HENT:  Head: Normocephalic and atraumatic.  Right Ear: Tympanic membrane and external ear normal.  Left Ear: Tympanic membrane and external ear normal.  Mouth/Throat: Oropharynx is clear and moist and mucous membranes are normal. No oral lesions. No trismus in the jaw. Abnormal dentition. No dental abscesses.    Several extractions present.  Eyes: Conjunctivae are normal.  Neck: Normal range of motion. Neck supple.  Cardiovascular: Normal rate and normal heart sounds.   Pulmonary/Chest: Effort normal.  Abdominal: She exhibits no distension.  Musculoskeletal: Normal range of motion.       Left hip: She exhibits normal range of motion, no bony tenderness and no deformity.  Full range of motion of left hip including flexion and extension which does  increase pain but pain is localized to the left buttock.  Passive internal/external rotation without pain.  Lymphadenopathy:    She has no cervical adenopathy.  Neurological: She is alert and oriented to person, place, and time.  Skin: Skin is warm and dry. There is erythema.  There is erythematous skin color change originating left of sacrum and spreading to left lateral buttock region in dermatomal distribution.  It is tender to palpation, no induration or fluctuance.  Skin is intact.  There are no signs of vesicles or rash at this time.  Psychiatric: She has a normal mood and affect.     ED Treatments / Results  Labs (all labs ordered are listed, but only abnormal results are displayed) Labs Reviewed - No data to display  EKG  EKG Interpretation None       Radiology Dg Hip Unilat W Or Wo Pelvis 2-3 Views Left  Result Date: 10/30/2016 CLINICAL DATA:  Hip pain EXAM: DG HIP (WITH OR WITHOUT PELVIS) 2-3V LEFT COMPARISON:  None. FINDINGS: Severe joint space narrowing and osteophytosis of the RIGHT hip joint. Total arthroplasty of the LEFT hip joint. Dedicated view of the LEFT hip joint demonstrates no fracture dislocation per IMPRESSION: 1. No acute findings rotator 2. Severe osteoarthritis of the RIGHT hip joint. Electronically Signed   By: Genevive BiStewart  Edmunds M.D.   On: 10/30/2016 14:55    Procedures Procedures (including critical care time)  Medications Ordered in ED Medications  HYDROmorphone (DILAUDID) injection 1 mg (1 mg Intramuscular Given 10/30/16 1436)  ondansetron (ZOFRAN-ODT) disintegrating tablet 4 mg (4 mg Oral Given 10/30/16 1436)     Initial Impression / Assessment and Plan / ED Course  I have reviewed the triage vital signs and the nursing notes.  Pertinent labs & imaging results that were available during my care of the patient were reviewed by me and considered in my medical decision making (see chart for details).     Exam and history suggesting patient  with 2 unrelated complaints, the first being an early dental infection/abscess, the second I suspect is early shingles of her left buttock.  There is no evidence for left hip joint involvement in patients pain or fever.  She has full range of motion of the left hip without pain localizing to this joint.  She was placed on clindamycin for the dental infection, also started on acyclovir.  Additional oxycodone prescribed for pain relief.  Discussed contagion precautions.  Also advised recheck by her PCP in 1 week if symptoms are continuing to worsen or not improving.  Plan follow-up with her dentist regarding the dental abscess.  Patient was discussed with Dr. Clarene DukeMcManus prior to discharge home.  Final Clinical Impressions(s) / ED Diagnoses   Final diagnoses:  Herpes zoster without complication  Dental abscess    New Prescriptions Discharge Medication List as of 10/30/2016  3:58 PM    START taking these medications   Details  acyclovir (ZOVIRAX) 800 MG tablet Take 1 tablet (800 mg total) by mouth 5 (five) times daily., Starting Tue 10/30/2016, Print    clindamycin (CLEOCIN) 300 MG capsule Take 1 capsule (300 mg total) by mouth 4 (four) times daily., Starting Tue 10/30/2016, Until Tue 11/06/2016, Print    oxyCODONE-acetaminophen (PERCOCET/ROXICET) 5-325 MG tablet Take 1 tablet by mouth every 4 (four) hours as needed., Starting Tue 10/30/2016, Print         Burgess AmorIdol, Ethyn Schetter, PA-C 11/01/16 1147    Samuel JesterMcManus, Kathleen, DO 11/02/16 [phone]

## 2016-10-30 NOTE — Discharge Instructions (Signed)
Take both medicines until completed.  You may take the oxycodone prescribed for pain relief.  This will make you drowsy - do not drive within 4 hours of taking this medication.

## 2017-03-08 HISTORY — PX: JOINT REPLACEMENT: SHX530

## 2017-06-25 ENCOUNTER — Encounter: Payer: Self-pay | Admitting: Obstetrics & Gynecology

## 2017-06-25 ENCOUNTER — Ambulatory Visit: Payer: BLUE CROSS/BLUE SHIELD | Admitting: Obstetrics & Gynecology

## 2017-06-25 VITALS — BP 99/54 | HR 80 | Ht 61.0 in | Wt 211.0 lb

## 2017-06-25 DIAGNOSIS — N3281 Overactive bladder: Secondary | ICD-10-CM | POA: Diagnosis not present

## 2017-06-25 DIAGNOSIS — N812 Incomplete uterovaginal prolapse: Secondary | ICD-10-CM | POA: Diagnosis not present

## 2017-06-25 DIAGNOSIS — Z4689 Encounter for fitting and adjustment of other specified devices: Secondary | ICD-10-CM

## 2017-06-25 DIAGNOSIS — N814 Uterovaginal prolapse, unspecified: Secondary | ICD-10-CM

## 2017-06-25 MED ORDER — SOLIFENACIN SUCCINATE 10 MG PO TABS: 10.0000 mg | ORAL_TABLET | Freq: Every day | ORAL | 11 refills | Status: DC

## 2017-06-25 NOTE — Progress Notes (Signed)
Chief Complaint  Patient presents with  . "feels like something has fallen"    Blood pressure (!) 99/54, pulse 80, height 5\' 1"  (1.549 m), weight 211 lb (95.7 kg).  [name] presents today for "feeling like something is falling out" I saw her back in 2015 for similar problem and OAB but she never followed up  On exam today she has Grade II uterine prolapse and Grade II cytocoele She reports no vaginal discharge or vaginal bleeding.  Milex ring #4 was placed, it is a snug fit but appropriate, if it is problematic I will switch to a #3  The pessary is placed, I had one in stock  [place]ilda Kubota will be sen back in 1 months for continued follow up.  Lazaro ArmsLuther H Eure, MD  06/25/2017 3:11 PM

## 2017-07-08 ENCOUNTER — Telehealth: Payer: Self-pay | Admitting: *Deleted

## 2017-07-08 NOTE — Telephone Encounter (Signed)
Patient came into the office requesting to be seen.  Informed we did not have any available appointments at this time.  States she has been having vaginal discharge and burning.  She has tried Vagisil with no relief.  Asked if something else could be sent in. Please advise.

## 2017-07-08 NOTE — Telephone Encounter (Signed)
Add her onto Wednesday schedule

## 2017-07-10 ENCOUNTER — Encounter: Payer: Self-pay | Admitting: Obstetrics & Gynecology

## 2017-07-10 ENCOUNTER — Ambulatory Visit: Payer: BLUE CROSS/BLUE SHIELD | Admitting: Obstetrics & Gynecology

## 2017-07-10 VITALS — BP 136/74 | HR 62 | Temp 98.0°F | Ht 61.0 in | Wt 211.0 lb

## 2017-07-10 DIAGNOSIS — B373 Candidiasis of vulva and vagina: Secondary | ICD-10-CM

## 2017-07-10 DIAGNOSIS — N3000 Acute cystitis without hematuria: Secondary | ICD-10-CM | POA: Diagnosis not present

## 2017-07-10 DIAGNOSIS — R350 Frequency of micturition: Secondary | ICD-10-CM

## 2017-07-10 DIAGNOSIS — B3731 Acute candidiasis of vulva and vagina: Secondary | ICD-10-CM

## 2017-07-10 LAB — POCT URINALYSIS DIPSTICK
Blood, UA: NEGATIVE
Glucose, UA: NEGATIVE
Ketones, UA: NEGATIVE
Leukocytes, UA: NEGATIVE
Protein, UA: NEGATIVE

## 2017-07-10 MED ORDER — NITROFURANTOIN MONOHYD MACRO 100 MG PO CAPS: 100.0000 mg | ORAL_CAPSULE | Freq: Two times a day (BID) | ORAL | 0 refills | Status: DC

## 2017-07-10 MED ORDER — FLUCONAZOLE 150 MG PO TABS: 150.0000 mg | ORAL_TABLET | Freq: Once | ORAL | 0 refills | Status: AC

## 2017-07-10 NOTE — Progress Notes (Signed)
Chief Complaint  Patient presents with  . Vaginal Itching    burning/ think has uti      62 y.o. G1P1001 No LMP recorded. Patient is postmenopausal. The current method of family planning is tubal ligation.  Outpatient Encounter Medications as of 07/10/2017  Medication Sig  . ALPRAZolam (XANAX) 0.5 MG tablet alprazolam 0.25 mg qhs  . Cholecalciferol (VITAMIN D3) 3000 UNITS TABS Take 1,000 Units by mouth. Take 1 tab daily  . solifenacin (VESICARE) 10 MG tablet Take 1 tablet (10 mg total) by mouth daily.  . [EXPIRED] fluconazole (DIFLUCAN) 150 MG tablet Take 1 tablet (150 mg total) by mouth once for 1 dose. Take the second tablet 3 days after the first one.  . losartan-hydrochlorothiazide (HYZAAR) 100-25 MG tablet losartan 100 mg-hydrochlorothiazide 25 mg tablet  . [DISCONTINUED] HYDROcodone-acetaminophen (LORTAB) 7.5-500 MG per tablet Take 1 tablet by mouth 2 (two) times daily.  . [DISCONTINUED] nitrofurantoin, macrocrystal-monohydrate, (MACROBID) 100 MG capsule Take 1 capsule (100 mg total) by mouth 2 (two) times daily.   No facility-administered encounter medications on file as of 07/10/2017.     Subjective Sharon Chung presents complaining of frequency urgency pressure lasting a few days Also was on antibiotic and now has vaginal itching burning Past Medical History:  Diagnosis Date  . Arthritis   . H/O hiatal hernia   . Hypertension     Past Surgical History:  Procedure Laterality Date  . TOTAL HIP ARTHROPLASTY Left 08/18/2013   Procedure: LEFT TOTAL HIP ARTHROPLASTY ANTERIOR APPROACH;  Surgeon: Shelda PalMatthew D Olin, MD;  Location: [facility];  Service: Orthopedics;  Laterality: Left;  . TUBAL LIGATION      OB History    Gravida  1   Para  1   Term  1   Preterm      AB      Living  1     SAB      TAB      Ectopic      Multiple      Live Births  1           No Known Allergies  Social History   Socioeconomic History  . Marital status: Divorced   Spouse name: Not on file  . Number of children: Not on file  . Years of education: Not on file  . Highest education level: Not on file  Occupational History  . Not on file  Social Needs  . Financial resource strain: Not on file  . Food insecurity:    Worry: Not on file    Inability: Not on file  . Transportation needs:    Medical: Not on file    Non-medical: Not on file  Tobacco Use  . Smoking status: Never Smoker  . Smokeless tobacco: Never Used  Substance and Sexual Activity  . Alcohol use: No  . Drug use: No  . Sexual activity: Not Currently    Birth control/protection: Post-menopausal  Lifestyle  . Physical activity:    Days per week: Not on file    Minutes per session: Not on file  . Stress: Not on file  Relationships  . Social connections:    Talks on phone: Not on file    Gets together: Not on file    Attends religious service: Not on file    Active member of club or organization: Not on file    Attends meetings of clubs or organizations: Not on file    Relationship status:  Not on file  Other Topics Concern  . Not on file  Social History Narrative  . Not on file    Family History  Problem Relation Age of Onset  . Heart attack Father   . Heart attack Mother   . Arthritis Mother   . Heart attack Paternal Grandfather   . Leukemia Paternal Grandmother   . Heart attack Maternal Grandmother   . Heart attack Maternal Grandfather     Medications:       Current Outpatient Medications:  .  ALPRAZolam (XANAX) 0.5 MG tablet, alprazolam 0.25 mg qhs, Disp: , Rfl:  .  Cholecalciferol (VITAMIN D3) 3000 UNITS TABS, Take 1,000 Units by mouth. Take 1 tab daily, Disp: , Rfl:  .  solifenacin (VESICARE) 10 MG tablet, Take 1 tablet (10 mg total) by mouth daily., Disp: 30 tablet, Rfl: 11 .  losartan-hydrochlorothiazide (HYZAAR) 100-25 MG tablet, losartan 100 mg-hydrochlorothiazide 25 mg tablet, Disp: , Rfl:   Objective Blood pressure 136/74, pulse 62, temperature 98 F  (36.7 C), height 5\' 1"  (1.549 m), weight 211 lb (95.7 kg).  Gen WDWN NAS  Pertinent ROS +vag itching No nausea, vomiting or diarrhea Nor fever chills or other constitutional symptoms   Labs or studies Nitrites positive Culture pending    Impression Diagnoses this Encounter::   ICD-10-CM   1. Acute cystitis without hematuria N30.00   2. Frequent urination R35.0 POCT urinalysis dipstick    Urine Culture  3. Candida vaginitis B37.3     Established relevant diagnosis(es):   Plan/Recommendations: Meds ordered this encounter  Medications  . DISCONTD: nitrofurantoin, macrocrystal-monohydrate, (MACROBID) 100 MG capsule    Sig: Take 1 capsule (100 mg total) by mouth 2 (two) times daily.    Dispense:  14 capsule    Refill:  0  . fluconazole (DIFLUCAN) 150 MG tablet    Sig: Take 1 tablet (150 mg total) by mouth once for 1 dose. Take the second tablet 3 days after the first one.    Dispense:  2 tablet    Refill:  0    Labs or Scans Ordered: Orders Placed This Encounter  Procedures  . Urine Culture  . POCT urinalysis dipstick    Management:: Macrobid for UTI Diflucan for post amoxicillin yeast infection  Follow up Return for keep scheduled.        All questions were answered.

## 2017-07-13 LAB — URINE CULTURE

## 2017-07-25 ENCOUNTER — Ambulatory Visit: Payer: BLUE CROSS/BLUE SHIELD | Admitting: Obstetrics & Gynecology

## 2017-07-30 ENCOUNTER — Other Ambulatory Visit: Payer: Self-pay

## 2017-07-30 ENCOUNTER — Ambulatory Visit: Payer: BLUE CROSS/BLUE SHIELD | Admitting: Obstetrics & Gynecology

## 2017-07-30 ENCOUNTER — Encounter: Payer: Self-pay | Admitting: Obstetrics & Gynecology

## 2017-07-30 VITALS — BP 158/78 | HR 65 | Ht 62.0 in | Wt 210.0 lb

## 2017-07-30 DIAGNOSIS — Z4689 Encounter for fitting and adjustment of other specified devices: Secondary | ICD-10-CM

## 2017-07-30 DIAGNOSIS — N814 Uterovaginal prolapse, unspecified: Secondary | ICD-10-CM | POA: Diagnosis not present

## 2017-07-30 NOTE — Progress Notes (Signed)
Chief Complaint  Patient presents with  . Follow-up    Blood pressure (!) 158/78, pulse 65, height 5\' 2"  (1.575 m), weight 210 lb (95.3 kg).  [name] presents today for routine follow up related to her pessary.   She uses a milex ring with support #4 She reports no vaginal discharge or vaginal bleeding.  Exam reveals no undue vaginal mucosal pressure of breakdown, no discharge and no vaginal bleeding.  The pessary is removed, cleaned and replaced without difficulty.    [name] will be sen back in 3.5 months for continued follow up.  Lazaro ArmsLuther H Macil Crady, MD  07/30/2017 9:19 AM

## 2017-11-20 ENCOUNTER — Ambulatory Visit: Payer: BLUE CROSS/BLUE SHIELD | Admitting: Obstetrics & Gynecology

## 2017-11-26 ENCOUNTER — Ambulatory Visit (INDEPENDENT_AMBULATORY_CARE_PROVIDER_SITE_OTHER): Payer: BLUE CROSS/BLUE SHIELD | Admitting: Obstetrics & Gynecology

## 2017-11-26 ENCOUNTER — Encounter: Payer: Self-pay | Admitting: Obstetrics & Gynecology

## 2017-11-26 VITALS — BP 133/73 | HR 65 | Ht 62.0 in | Wt 207.5 lb

## 2017-11-26 DIAGNOSIS — N814 Uterovaginal prolapse, unspecified: Secondary | ICD-10-CM

## 2017-11-26 DIAGNOSIS — Z4689 Encounter for fitting and adjustment of other specified devices: Secondary | ICD-10-CM | POA: Diagnosis not present

## 2017-11-26 NOTE — Progress Notes (Signed)
Chief Complaint  Patient presents with  . Pessary cleaning    Blood pressure 133/73, pulse 65, height 5\' 2"  (1.575 m), weight 207 lb 8 oz (94.1 kg).  Sharon SalinaHilda Chung presents today for routine follow up related to her pessary.   She uses a Milex ring with support #4 She reports no vaginal discharge or vaginal bleeding.  Exam reveals no undue vaginal mucosal pressure of breakdown, no discharge and no vaginal bleeding.  The pessary is removed, cleaned and replaced without difficulty.    Sharon Chung will be sen back in 4 months for continued follow up.  Lazaro ArmsLuther H Kourtland Coopman, MD  11/26/2017 10:41 AM

## 2018-01-16 DIAGNOSIS — M818 Other osteoporosis without current pathological fracture: Secondary | ICD-10-CM | POA: Diagnosis not present

## 2018-01-16 DIAGNOSIS — M810 Age-related osteoporosis without current pathological fracture: Secondary | ICD-10-CM | POA: Diagnosis not present

## 2018-02-21 DIAGNOSIS — H2513 Age-related nuclear cataract, bilateral: Secondary | ICD-10-CM | POA: Diagnosis not present

## 2018-02-21 DIAGNOSIS — H40033 Anatomical narrow angle, bilateral: Secondary | ICD-10-CM | POA: Diagnosis not present

## 2018-03-04 DIAGNOSIS — M545 Low back pain: Secondary | ICD-10-CM | POA: Diagnosis not present

## 2018-03-04 DIAGNOSIS — I10 Essential (primary) hypertension: Secondary | ICD-10-CM | POA: Diagnosis not present

## 2018-03-27 ENCOUNTER — Ambulatory Visit: Payer: BLUE CROSS/BLUE SHIELD | Admitting: Obstetrics & Gynecology

## 2018-03-28 ENCOUNTER — Ambulatory Visit: Payer: Self-pay | Admitting: Obstetrics & Gynecology

## 2018-05-23 ENCOUNTER — Other Ambulatory Visit: Payer: Self-pay

## 2018-05-23 ENCOUNTER — Encounter (HOSPITAL_COMMUNITY): Payer: Self-pay | Admitting: Emergency Medicine

## 2018-05-23 ENCOUNTER — Emergency Department (HOSPITAL_COMMUNITY): Payer: Medicare Other

## 2018-05-23 ENCOUNTER — Inpatient Hospital Stay (HOSPITAL_COMMUNITY)
Admission: EM | Admit: 2018-05-23 | Discharge: 2018-05-26 | DRG: 419 | Disposition: A | Discharge status: Home / Self Care | Payer: Medicare Other | Attending: General Surgery | Admitting: General Surgery

## 2018-05-23 DIAGNOSIS — E876 Hypokalemia: Secondary | ICD-10-CM | POA: Diagnosis present

## 2018-05-23 DIAGNOSIS — Z8261 Family history of arthritis: Secondary | ICD-10-CM | POA: Diagnosis not present

## 2018-05-23 DIAGNOSIS — Z9851 Tubal ligation status: Secondary | ICD-10-CM

## 2018-05-23 DIAGNOSIS — Z79899 Other long term (current) drug therapy: Secondary | ICD-10-CM

## 2018-05-23 DIAGNOSIS — Z8249 Family history of ischemic heart disease and other diseases of the circulatory system: Secondary | ICD-10-CM

## 2018-05-23 DIAGNOSIS — K449 Diaphragmatic hernia without obstruction or gangrene: Secondary | ICD-10-CM | POA: Diagnosis not present

## 2018-05-23 DIAGNOSIS — I10 Essential (primary) hypertension: Secondary | ICD-10-CM | POA: Diagnosis not present

## 2018-05-23 DIAGNOSIS — K819 Cholecystitis, unspecified: Secondary | ICD-10-CM

## 2018-05-23 DIAGNOSIS — Z806 Family history of leukemia: Secondary | ICD-10-CM | POA: Diagnosis not present

## 2018-05-23 DIAGNOSIS — K8020 Calculus of gallbladder without cholecystitis without obstruction: Secondary | ICD-10-CM | POA: Diagnosis not present

## 2018-05-23 DIAGNOSIS — K08409 Partial loss of teeth, unspecified cause, unspecified class: Secondary | ICD-10-CM | POA: Diagnosis present

## 2018-05-23 DIAGNOSIS — R1013 Epigastric pain: Secondary | ICD-10-CM | POA: Diagnosis not present

## 2018-05-23 DIAGNOSIS — K8000 Calculus of gallbladder with acute cholecystitis without obstruction: Secondary | ICD-10-CM | POA: Diagnosis not present

## 2018-05-23 DIAGNOSIS — M1990 Unspecified osteoarthritis, unspecified site: Secondary | ICD-10-CM | POA: Diagnosis present

## 2018-05-23 DIAGNOSIS — R1011 Right upper quadrant pain: Secondary | ICD-10-CM | POA: Diagnosis present

## 2018-05-23 DIAGNOSIS — Z96643 Presence of artificial hip joint, bilateral: Secondary | ICD-10-CM | POA: Diagnosis present

## 2018-05-23 DIAGNOSIS — Z03818 Encounter for observation for suspected exposure to other biological agents ruled out: Secondary | ICD-10-CM | POA: Diagnosis not present

## 2018-05-23 DIAGNOSIS — Z20828 Contact with and (suspected) exposure to other viral communicable diseases: Secondary | ICD-10-CM | POA: Diagnosis present

## 2018-05-23 MED ORDER — FENTANYL CITRATE (PF) 100 MCG/2ML IJ SOLN
50.0000 ug | Freq: Once | INTRAMUSCULAR | Status: AC
  Administered 2018-05-23: 50 ug via INTRAVENOUS
  Filled 2018-05-23: qty 2

## 2018-05-23 MED ORDER — ONDANSETRON HCL 4 MG/2ML IJ SOLN
4.0000 mg | Freq: Once | INTRAMUSCULAR | Status: AC
  Administered 2018-05-23: 4 mg via INTRAVENOUS
  Filled 2018-05-23: qty 2

## 2018-05-23 NOTE — ED Provider Notes (Signed)
[facility] EMERGENCY DEPARTMENT Provider Note   CSN: [id number] Arrival date & time: 05/23/18  2311    History   Chief Complaint Chief Complaint  Patient presents with   Abdominal Pain    HPI Sharon Chung is a 63 y.o. female.     Patient presents with upper abdominal pain that radiates to her mid back associated with nausea and belching.  The pain onset about 1:30 PM today and has been constant.  She attributes this pain to her hiatal hernia that was diagnosed by EGD in the past.  She states she has had this pain in the past but never this severe.  She has not prescribed any medications for her stomach.  She had one episode of vomiting on arrival here.  Denies any diarrhea, chills, chest pain, shortness of breath, cough or fever.  The pain is in the center of her abdomen and radiates to her back and right upper quadrant.  She still has a gallbladder and appendix.  Denies any pain with urination or blood in the urine.  The history is provided by the patient.  Abdominal Pain  Associated symptoms: nausea and vomiting   Associated symptoms: no chest pain, no cough, no dysuria, no fever, no hematuria and no shortness of breath     Past Medical History:  Diagnosis Date   Arthritis    H/O hiatal hernia    Hypertension     Patient Active Problem List   Diagnosis Date Noted   Overactive bladder 10/13/2013   Postoperative anemia due to acute blood loss 08/19/2013   Obese 08/19/2013   S/P left THA, AA 08/18/2013    Past Surgical History:  Procedure Laterality Date   TOTAL HIP ARTHROPLASTY Left 08/18/2013   Procedure: LEFT TOTAL HIP ARTHROPLASTY ANTERIOR APPROACH;  Surgeon: Shelda Pal, MD;  Location: [facility];  Service: Orthopedics;  Laterality: Left;   TUBAL LIGATION       OB History    Gravida  1   Para  1   Term  1   Preterm      AB      Living  1     SAB      TAB      Ectopic      Multiple      Live Births  1            Home  Medications    Prior to Admission medications   Medication Sig Start Date End Date Taking? Authorizing Provider  ALPRAZolam (XANAX) 0.5 MG tablet alprazolam 0.25 mg qhs   Yes [provider]  Cholecalciferol (VITAMIN D3) 3000 UNITS TABS Take 1,000 Units by mouth. Take 1 tab daily   Yes [provider]  losartan (COZAAR) 50 MG tablet Take 50 mg by mouth daily.   Yes [provider]  solifenacin (VESICARE) 10 MG tablet Take 1 tablet (10 mg total) by mouth daily. Patient taking differently: Take 10 mg by mouth once a week.  06/25/17   Lazaro Arms, MD    Family History Family History  Problem Relation Age of Onset   Heart attack Father    Heart attack Mother    Arthritis Mother    Heart attack Paternal Grandfather    Leukemia Paternal Grandmother    Heart attack Maternal Grandmother    Heart attack Maternal Grandfather     Social History Social History   Tobacco Use   Smoking status: Never Smoker   Smokeless tobacco:  Never Used  Substance Use Topics   Alcohol use: No   Drug use: No     Allergies   Patient has no known allergies.   Review of Systems Review of Systems  Constitutional: Negative for activity change, appetite change and fever.  HENT: Negative for congestion and sinus pain.   Eyes: Negative for visual disturbance.  Respiratory: Negative for cough, chest tightness and shortness of breath.   Cardiovascular: Negative for chest pain.  Gastrointestinal: Positive for abdominal pain, nausea and vomiting.  Genitourinary: Negative for dysuria and hematuria.  Musculoskeletal: Positive for back pain.  Skin: Negative for rash.  Neurological: Negative for dizziness, weakness and headaches.    all other systems are negative except as noted in the HPI and PMH.    Physical Exam Updated Vital Signs BP (!) 162/60 (BP Location: Right Arm)    Pulse (!) 59    Temp 98.2 F (36.8 C) (Oral)    Resp 20    Ht  (1.575 m)    Wt 90.7 kg     SpO2 97%    BMI 36.58 kg/m   Physical Exam Vitals signs and nursing note reviewed.  Constitutional:      General: She is not in acute distress.    Appearance: She is well-developed. She is obese.     Comments: uncomfortable  HENT:     Head: Normocephalic and atraumatic.     Mouth/Throat:     Pharynx: No oropharyngeal exudate.  Eyes:     Conjunctiva/sclera: Conjunctivae normal.     Pupils: Pupils are equal, round, and reactive to light.  Neck:     Musculoskeletal: Normal range of motion and neck supple.     Comments: No meningismus. Cardiovascular:     Rate and Rhythm: Normal rate and regular rhythm.     Heart sounds: Normal heart sounds. No murmur.     Comments: Intact femoral DP and PT pulses bilaterally. Pulmonary:     Effort: Pulmonary effort is normal. No respiratory distress.     Breath sounds: Normal breath sounds.  Abdominal:     Palpations: Abdomen is soft.     Tenderness: There is abdominal tenderness. There is guarding. There is no rebound.     Comments: Soft abdomen, epigastric right upper quadrant tenderness with voluntary guarding  Musculoskeletal: Normal range of motion.        General: No tenderness.     Comments: Equal upper extremity grip strength and radial pulses.  Skin:    General: Skin is warm.     Capillary Refill: Capillary refill takes less than 2 seconds.  Neurological:     General: No focal deficit present.     Mental Status: She is alert and oriented to person, place, and time. Mental status is at baseline.     Cranial Nerves: No cranial nerve deficit.     Motor: No abnormal muscle tone.     Coordination: Coordination normal.     Comments: No ataxia on finger to nose bilaterally. No pronator drift. 5/5 strength throughout. CN 2-12 intact.Equal grip strength. Sensation intact.   Psychiatric:        Behavior: Behavior normal.      ED Treatments / Results  Labs (all labs ordered are listed, but only abnormal results are displayed) Labs  Reviewed  COMPREHENSIVE METABOLIC PANEL - Abnormal; Notable for the following components:      Result Value   Potassium 3.3 (*)    CO2 21 (*)  Glucose, Bld 154 (*)    BUN 29 (*)    All other components within normal limits  SARS CORONAVIRUS 2 (HOSPITAL ORDER, PERFORMED IN [facility])  CBC WITH DIFFERENTIAL/PLATELET  LIPASE, BLOOD  TROPONIN I  URINALYSIS, ROUTINE W REFLEX MICROSCOPIC    EKG EKG Interpretation  Date/Time:  Friday May 23 2018 23:22:54 EDT Ventricular Rate:  59 PR Interval:    QRS Duration: 98 QT Interval:  435 QTC Calculation: 431 R Axis:   5 Text Interpretation:  Sinus rhythm Baseline wander in lead(s) V2 No significant change was found Confirmed by Glynn Octaveancour, Charlesa Ehle [phone]([id number]) on 05/23/2018 11:33:01 PM   Radiology Ct Abdomen Pelvis W Contrast  Result Date: 05/24/2018 CLINICAL DATA:  Right upper quadrant pain. Episode of vomiting. EXAM: CT ABDOMEN AND PELVIS WITH CONTRAST TECHNIQUE: Multidetector CT imaging of the abdomen and pelvis was performed using the standard protocol following bolus administration of intravenous contrast. CONTRAST:  100mL OMNIPAQUE IOHEXOL 300 MG/ML  SOLN COMPARISON:  Radiograph yesterday. FINDINGS: Lower chest: Subsegmental bilateral lower lobe atelectasis. There are coronary artery calcifications. Hepatobiliary: No focal hepatic abnormality. Gallstone within mildly distended gallbladder. Common bile duct is normal in caliber, there is mild intrahepatic biliary ductal dilatation. Equivocal mild pericholecystic stranding about the inferior fundus. Pancreas: No ductal dilatation or inflammation. Spleen: Normal in size without focal abnormality. Adrenals/Urinary Tract: Normal adrenal glands. No hydronephrosis or perinephric edema. Homogeneous renal enhancement with symmetric excretion on delayed phase imaging. Subcentimeter low-density in the mid right kidney is too small to characterize. Urinary bladder is physiologically distended  without wall thickening, streak artifact from bilateral hip arthroplasties partially limits evaluation. Stomach/Bowel: Small hiatal hernia. Stomach is decompressed. No small bowel wall thickening, inflammatory change, or obstruction. Normal appendix. Diverticulosis of the descending and sigmoid colon without diverticulitis. Portions of the sigmoid colon are obscured by streak artifact from hip arthroplasties. Moderate colonic stool burden. Vascular/Lymphatic: Aortic atherosclerosis without aneurysm. Portal vein and mesenteric vessels are patent. No enlarged lymph nodes in the abdomen or pelvis. Reproductive: Uterus and bilateral adnexa are unremarkable. Other: No ascites or free air. Musculoskeletal: There are no acute or suspicious osseous abnormalities. degenerative change in the spine. Bilateral hip arthroplasties. IMPRESSION: 1. Gallstones with mild gallbladder distention and equivocal pericholecystic stranding. Recommend right upper quadrant ultrasound for further evaluation. 2. Mild intrahepatic biliary ductal dilatation. Common bile duct is normal in caliber. 3. Colonic diverticulosis without diverticulitis. 4. Small hiatal hernia. 5.  Aortic Atherosclerosis (ICD10-I70.0). Electronically Signed   By: Narda RutherfordMelanie  Sanford M.D.   On: 05/24/2018 01:53   Dg Chest Portable 1 View  Result Date: 05/24/2018 CLINICAL DATA:  Epigastric pain EXAM: PORTABLE CHEST 1 VIEW COMPARISON:  03/13/2016 FINDINGS: The heart size and mediastinal contours are within normal limits. Both lungs are clear. The visualized skeletal structures are unremarkable. IMPRESSION: No active disease. Electronically Signed   By: Deatra RobinsonKevin  Herman M.D.   On: 05/24/2018 00:16   Dg Abd Portable 2 Views  Result Date: 05/24/2018 CLINICAL DATA:  Epigastric pain EXAM: PORTABLE ABDOMEN - 2 VIEW COMPARISON:  None. FINDINGS: The bowel gas pattern is normal. There is no evidence of free air. No radio-opaque calculi or other significant radiographic abnormality  is seen. IMPRESSION: Negative. Electronically Signed   By: Deatra RobinsonKevin  Herman M.D.   On: 05/24/2018 00:17    Procedures Procedures (including critical care time)  Medications Ordered in ED Medications  ondansetron (ZOFRAN) injection 4 mg (has no administration in time range)  fentaNYL (SUBLIMAZE) injection 50 mcg (has no  administration in time range)     Initial Impression / Assessment and Plan / ED Course  I have reviewed the triage vital signs and the nursing notes.  Pertinent labs & imaging results that were available during my care of the patient were reviewed by me and considered in my medical decision making (see chart for details).       Constant upper abdominal pain with nausea vomiting since about 1:30 PM.  EKG is nonischemic. History of hiatal hernia with similar pain in the past.   Equal upper extremity blood pressures and grip strength and radial pulses.  Labs show normal LFTs, lipase, and white blood cell count. EKG unchanged. Troponin negative. Low suspicion for ACS.  Concern for gallbladder pathology.  Ultrasound is not available.  CT scan is obtained which shows distended gallbladder with large gallstone and stranding.  Patient still with ongoing pain and nausea.  Plan admission for IV antibiotics, IV fluids and ultrasound in the morning.  Will likely need surgical consult tomorrow for possible cholecystectomy.  Ultrasound not available tonight.  Observation admission discussed with Dr. Robb Matar.    Final Clinical Impressions(s) / ED Diagnoses   Final diagnoses:  RUQ pain  Cholecystitis    ED Discharge Orders    None       Talia Hoheisel, Jeannett Senior, MD 05/24/18 [phone]

## 2018-05-23 NOTE — ED Triage Notes (Signed)
Pt states she has a hiatal hernia and that she started having pain at hernia site after eating cheese ball today. Pt vomited once in triage . C/o pain in across upper abdomen and runs through to her back.

## 2018-05-24 ENCOUNTER — Observation Stay (HOSPITAL_COMMUNITY): Payer: Medicare Other

## 2018-05-24 ENCOUNTER — Encounter (HOSPITAL_COMMUNITY): Payer: Self-pay | Admitting: Internal Medicine

## 2018-05-24 ENCOUNTER — Emergency Department (HOSPITAL_COMMUNITY): Payer: Medicare Other

## 2018-05-24 DIAGNOSIS — K8020 Calculus of gallbladder without cholecystitis without obstruction: Secondary | ICD-10-CM | POA: Diagnosis not present

## 2018-05-24 DIAGNOSIS — R1011 Right upper quadrant pain: Secondary | ICD-10-CM | POA: Diagnosis not present

## 2018-05-24 DIAGNOSIS — E876 Hypokalemia: Secondary | ICD-10-CM | POA: Diagnosis present

## 2018-05-24 DIAGNOSIS — I10 Essential (primary) hypertension: Secondary | ICD-10-CM | POA: Diagnosis present

## 2018-05-24 LAB — TROPONIN I: Troponin I: <0.03 ng/mL (ref <0.03)

## 2018-05-24 LAB — CBC WITH DIFFERENTIAL/PLATELET
Abs Immature Granulocytes: 0.03 10*3/uL (ref 0.00–0.07)
Abs Immature Granulocytes: 0.03 10*3/uL (ref 0.00–0.07)
Basophils Absolute: 0 10*3/uL (ref 0.0–0.1)
Basophils Absolute: 0 10*3/uL (ref 0.0–0.1)
Basophils Relative: 0 %
Basophils Relative: 0 %
Eosinophils Absolute: 0 10*3/uL (ref 0.0–0.5)
Eosinophils Absolute: 0 10*3/uL (ref 0.0–0.5)
Eosinophils Relative: 0 %
Eosinophils Relative: 0 %
HCT: 38.7 % (ref 36.0–46.0)
HCT: 39.6 % (ref 36.0–46.0)
Hemoglobin: 12.8 g/dL (ref 12.0–15.0)
Hemoglobin: 13.3 g/dL (ref 12.0–15.0)
Immature Granulocytes: 0 %
Immature Granulocytes: 1 %
Lymphocytes Relative: 13 %
Lymphocytes Relative: 18 %
Lymphs Abs: 1 10*3/uL (ref 0.7–4.0)
Lymphs Abs: 1.1 10*3/uL (ref 0.7–4.0)
MCH: 28.5 pg (ref 26.0–34.0)
MCH: 28.7 pg (ref 26.0–34.0)
MCHC: 33.1 g/dL (ref 30.0–36.0)
MCHC: 33.6 g/dL (ref 30.0–36.0)
MCV: 85 fL (ref 80.0–100.0)
MCV: 86.8 fL (ref 80.0–100.0)
Monocytes Absolute: 0.2 10*3/uL (ref 0.1–1.0)
Monocytes Absolute: 0.4 10*3/uL (ref 0.1–1.0)
Monocytes Relative: 4 %
Monocytes Relative: 5 %
Neutro Abs: 4.6 10*3/uL (ref 1.7–7.7)
Neutro Abs: 6.6 10*3/uL (ref 1.7–7.7)
Neutrophils Relative %: 77 %
Neutrophils Relative %: 82 %
Platelets: 176 10*3/uL (ref 150–400)
Platelets: 181 10*3/uL (ref 150–400)
RBC: 4.46 MIL/uL (ref 3.87–5.11)
RBC: 4.66 MIL/uL (ref 3.87–5.11)
RDW: 13 % (ref 11.5–15.5)
RDW: 13.1 % (ref 11.5–15.5)
WBC: 5.9 10*3/uL (ref 4.0–10.5)
WBC: 8.1 10*3/uL (ref 4.0–10.5)
nRBC: 0 % (ref 0.0–0.2)
nRBC: 0 % (ref 0.0–0.2)

## 2018-05-24 LAB — COMPREHENSIVE METABOLIC PANEL
ALT: 19 U/L (ref 0–44)
ALT: 20 U/L (ref 0–44)
AST: 21 U/L (ref 15–41)
AST: 23 U/L (ref 15–41)
Albumin: 3.9 g/dL (ref 3.5–5.0)
Albumin: 4.4 g/dL (ref 3.5–5.0)
Alkaline Phosphatase: 44 U/L (ref 38–126)
Alkaline Phosphatase: 48 U/L (ref 38–126)
Anion gap: 10 (ref 5–15)
Anion gap: 14 (ref 5–15)
BUN: 20 mg/dL (ref 8–23)
BUN: 29 mg/dL — ABNORMAL HIGH (ref 8–23)
CO2: 21 mmol/L — ABNORMAL LOW (ref 22–32)
CO2: 25 mmol/L (ref 22–32)
Calcium: 8.5 mg/dL — ABNORMAL LOW (ref 8.9–10.3)
Calcium: 9.5 mg/dL (ref 8.9–10.3)
Chloride: 103 mmol/L (ref 98–111)
Chloride: 104 mmol/L (ref 98–111)
Creatinine, Ser: 0.65 mg/dL (ref 0.44–1.00)
Creatinine, Ser: 0.77 mg/dL (ref 0.44–1.00)
GFR calc Af Amer: >60 mL/min (ref >60)
GFR calc Af Amer: >60 mL/min (ref >60)
GFR calc non Af Amer: >60 mL/min (ref >60)
GFR calc non Af Amer: >60 mL/min (ref >60)
Glucose, Bld: 119 mg/dL — ABNORMAL HIGH (ref 70–99)
Glucose, Bld: 154 mg/dL — ABNORMAL HIGH (ref 70–99)
Potassium: 3.3 mmol/L — ABNORMAL LOW (ref 3.5–5.1)
Potassium: 3.7 mmol/L (ref 3.5–5.1)
Sodium: 138 mmol/L (ref 135–145)
Sodium: 139 mmol/L (ref 135–145)
Total Bilirubin: 0.6 mg/dL (ref 0.3–1.2)
Total Bilirubin: 0.6 mg/dL (ref 0.3–1.2)
Total Protein: 7.1 g/dL (ref 6.5–8.1)
Total Protein: 7.6 g/dL (ref 6.5–8.1)

## 2018-05-24 LAB — URINALYSIS, ROUTINE W REFLEX MICROSCOPIC
Bacteria, UA: NONE SEEN
Bilirubin Urine: NEGATIVE
Glucose, UA: NEGATIVE mg/dL
Hgb urine dipstick: NEGATIVE
Ketones, ur: 5 mg/dL — AB
Nitrite: NEGATIVE
Protein, ur: NEGATIVE mg/dL
Specific Gravity, Urine: >1.046 — ABNORMAL HIGH (ref 1.005–1.030)
pH: 5 (ref 5.0–8.0)

## 2018-05-24 LAB — SARS CORONAVIRUS 2 BY RT PCR (HOSPITAL ORDER, PERFORMED IN ~~LOC~~ HOSPITAL LAB): SARS Coronavirus 2: NEGATIVE

## 2018-05-24 LAB — LIPASE, BLOOD: Lipase: 34 U/L (ref 11–51)

## 2018-05-24 LAB — PHOSPHORUS: Phosphorus: 3.7 mg/dL (ref 2.5–4.6)

## 2018-05-24 LAB — MAGNESIUM: Magnesium: 2.1 mg/dL (ref 1.7–2.4)

## 2018-05-24 MED ORDER — ACETAMINOPHEN 650 MG RE SUPP: 650.0000 mg | Freq: Four times a day (QID) | RECTAL | Status: DC | PRN

## 2018-05-24 MED ORDER — METRONIDAZOLE IN NACL 5-0.79 MG/ML-% IV SOLN
500.0000 mg | Freq: Once | INTRAVENOUS | Status: AC
  Administered 2018-05-24: 500 mg via INTRAVENOUS
  Filled 2018-05-24: qty 100

## 2018-05-24 MED ORDER — FENTANYL CITRATE (PF) 100 MCG/2ML IJ SOLN
50.0000 ug | Freq: Once | INTRAMUSCULAR | Status: AC
  Administered 2018-05-24: 50 ug via INTRAVENOUS
  Filled 2018-05-24: qty 2

## 2018-05-24 MED ORDER — ACETAMINOPHEN 325 MG PO TABS: 650.0000 mg | ORAL_TABLET | Freq: Four times a day (QID) | ORAL | Status: DC | PRN

## 2018-05-24 MED ORDER — OXYCODONE HCL 5 MG PO TABS
5.0000 mg | ORAL_TABLET | ORAL | Status: DC | PRN
  Administered 2018-05-26: 5 mg via ORAL
  Filled 2018-05-24: qty 1

## 2018-05-24 MED ORDER — IOHEXOL 300 MG/ML  SOLN
100.0000 mL | Freq: Once | INTRAMUSCULAR | Status: AC | PRN
  Administered 2018-05-24: 100 mL via INTRAVENOUS

## 2018-05-24 MED ORDER — PANTOPRAZOLE SODIUM 40 MG IV SOLR
40.0000 mg | INTRAVENOUS | Status: DC
  Administered 2018-05-24 – 2018-05-26 (×3): 40 mg via INTRAVENOUS
  Filled 2018-05-24 (×3): qty 40

## 2018-05-24 MED ORDER — POTASSIUM CHLORIDE IN NACL 20-0.9 MEQ/L-% IV SOLN
INTRAVENOUS | Status: DC
  Administered 2018-05-24 – 2018-05-25 (×3): via INTRAVENOUS

## 2018-05-24 MED ORDER — PIPERACILLIN-TAZOBACTAM 3.375 G IVPB
3.3750 g | Freq: Three times a day (TID) | INTRAVENOUS | Status: DC
  Administered 2018-05-24: 07:00:00 3.375 g via INTRAVENOUS
  Filled 2018-05-24: qty 50

## 2018-05-24 MED ORDER — SODIUM CHLORIDE 0.9 % IV BOLUS
1000.0000 mL | Freq: Once | INTRAVENOUS | Status: AC
  Administered 2018-05-24: 500 mL via INTRAVENOUS

## 2018-05-24 MED ORDER — HYDROMORPHONE HCL 1 MG/ML IJ SOLN: 1.0000 mg | INTRAMUSCULAR | Status: DC | PRN

## 2018-05-24 MED ORDER — PIPERACILLIN-TAZOBACTAM 3.375 G IVPB 30 MIN: 3.3750 g | Freq: Three times a day (TID) | INTRAVENOUS | Status: DC

## 2018-05-24 MED ORDER — ONDANSETRON HCL 4 MG PO TABS: 4.0000 mg | ORAL_TABLET | Freq: Four times a day (QID) | ORAL | Status: DC | PRN

## 2018-05-24 MED ORDER — SODIUM CHLORIDE 0.9 % IV SOLN
2.0000 g | INTRAVENOUS | Status: DC
  Administered 2018-05-24 – 2018-05-25 (×2): 2 g via INTRAVENOUS
  Filled 2018-05-24 (×2): qty 20

## 2018-05-24 MED ORDER — HYDROMORPHONE HCL 1 MG/ML IJ SOLN
1.0000 mg | INTRAMUSCULAR | Status: DC | PRN
  Administered 2018-05-24 (×2): 1 mg via INTRAVENOUS
  Filled 2018-05-24 (×2): qty 1

## 2018-05-24 MED ORDER — SODIUM CHLORIDE 0.9 % IV SOLN
2.0000 g | Freq: Once | INTRAVENOUS | Status: AC
  Administered 2018-05-24: 2 g via INTRAVENOUS
  Filled 2018-05-24: qty 20

## 2018-05-24 MED ORDER — ONDANSETRON HCL 4 MG/2ML IJ SOLN
4.0000 mg | Freq: Four times a day (QID) | INTRAMUSCULAR | Status: DC | PRN
  Administered 2018-05-24 (×2): 4 mg via INTRAVENOUS
  Filled 2018-05-24 (×2): qty 2

## 2018-05-24 NOTE — H&P (Signed)
History and Physical    Sharon Chung ZOX:[id number] DOB: 03-Aug-1955 DOA: 05/23/2018  PCP: Toma Deiters, MD   Patient coming from: Home.  I have personally briefly reviewed patient's old medical records in [facility]  Chief Complaint: Abdominal pain.  HPI: Sharon Chung is a 63 y.o. female with medical history significant of osteoarthritis, hiatal hernia, hypertension who is coming to the emergency department due to epigastric/RUQ abdominal pain after eating a cheese ball earlier today.  She did not had any nausea or vomiting at home, but threw up once while in triage.  Patient states that this came all of a sudden.  She was nauseous after this.  She denies diarrhea, melena or hematochezia.  She complains of frequent flatulence.  She denies dysuria, frequency or hematuria.  No fever, chills, sore throat, rhinorrhea, dyspnea, productive cough, chest pain, palpitations, dizziness, diaphoresis, PND, orthopnea or pitting edema of the lower extremities.  She denies polyuria, polydipsia, polyphagia or blurred vision.  ED Course: Initial vital signs were temperature 98.2 F, pulse 59, respirations 20, blood pressure 162/60 mmHg and O2 sat 97% on room air.  CBC was normal.  Lipase was 34.  CMP shows a potassium of 3.3 and CO2 of 21 mmol/L.  Glucose was elevated 154, BUN was 29, but the rest of the CMP is within normal limits.  Troponin was normal.  Coronavirus 2 nasal swab was negative.  Imaging: Abdominal and chest radiographs were within normal limits.  CT abdomen/pelvis showed gallstones with mild gallbladder distention and equivocal pericholecystic stranding.  RUQ [place] was recommended.  There is mild intrahepatic biliary ductal dilatation.  CBD was normal in caliber.  There is colonic diverticulosis but no diverticulitis.  Small hiatal hernia.  Aortic atherosclerosis.  Please see images and full radiology report for further detail.  Review of Systems: As per HPI otherwise 10 point review of systems  negative.   Past Medical History:  Diagnosis Date   Arthritis    H/O hiatal hernia    Hypertension     Past Surgical History:  Procedure Laterality Date   JOINT REPLACEMENT Right 03/2017   Right THA.   TOTAL HIP ARTHROPLASTY Left 08/18/2013   Procedure: LEFT TOTAL HIP ARTHROPLASTY ANTERIOR APPROACH;  Surgeon: Shelda Pal, MD;  Location: [facility];  Service: Orthopedics;  Laterality: Left;   TUBAL LIGATION       reports that she has never smoked. She has never used smokeless tobacco. She reports that she does not drink alcohol or use drugs.  No Known Allergies  Family History  Problem Relation Age of Onset   Heart attack Father    Heart attack Mother    Arthritis Mother    Heart attack Paternal Grandfather    Leukemia Paternal Grandmother    Heart attack Maternal Grandmother    Heart attack Maternal Grandfather    Prior to Admission medications   Medication Sig Start Date End Date Taking? Authorizing Provider  ALPRAZolam (XANAX) 0.5 MG tablet alprazolam 0.25 mg qhs   Yes [provider]  Cholecalciferol (VITAMIN D3) 3000 UNITS TABS Take 1,000 Units by mouth. Take 1 tab daily   Yes [provider]  losartan (COZAAR) 50 MG tablet Take 50 mg by mouth daily.   Yes [provider]  solifenacin (VESICARE) 10 MG tablet Take 1 tablet (10 mg total) by mouth daily. Patient taking differently: Take 10 mg by mouth once a week.  06/25/17   Lazaro Arms, MD    Physical  Exam: Vitals:   05/24/18 0212 05/24/18 0230 05/24/18 0307 05/24/18 0410  BP: (!) 148/70 (!) 145/75 (!) 150/69 (!) 149/63  Pulse: 60 (!) 58 (!) 57 (!) 57  Resp: 20 16 16 16   Temp:    98.3 F (36.8 C)  TempSrc:    Oral  SpO2: 98% 94% 94% 99%  Weight:      Height:        Constitutional: NAD, calm, comfortable Eyes: PERRL, lids and conjunctivae normal ENMT: Mucous membranes are moist. Posterior pharynx clear of any exudate or lesions. Neck: normal, supple, no masses, no  thyromegaly Respiratory: clear to auscultation bilaterally, no wheezing, no crackles. Normal respiratory effort. No accessory muscle use.  Cardiovascular: Regular rate and rhythm, no murmurs / rubs / gallops. No extremity edema. 2+ pedal pulses. No carotid bruits.  Abdomen: Obese, soft, positive for gastric and RUQ tenderness, no guarding or rebound tenderness (recently medicated with analgesics), no masses palpated. No hepatosplenomegaly. Bowel sounds positive.  Musculoskeletal: no clubbing / cyanosis. Good ROM, no contractures. Normal muscle tone.  Skin: no rashes, lesions, ulcers on limited dermatological examination. Neurologic: CN 2-12 grossly intact. Sensation intact, DTR normal. Strength 5/5 in all 4.  Psychiatric: Normal judgment and insight. Alert and oriented x 3. Normal mood.   Labs on Admission: I have personally reviewed following labs and imaging studies  CBC: Recent Labs  Lab 05/23/18 2336  WBC 5.9  NEUTROABS 4.6  HGB 13.3  HCT 39.6  MCV 85.0  PLT 181   Basic Metabolic Panel: Recent Labs  Lab 05/23/18 2336  NA 138  K 3.3*  CL 103  CO2 21*  GLUCOSE 154*  BUN 29*  CREATININE 0.77  CALCIUM 9.5   GFR: Estimated Creatinine Clearance: 73.4 mL/min (by C-G formula based on SCr of 0.77 mg/dL). Liver Function Tests: Recent Labs  Lab 05/23/18 2336  AST 23  ALT 20  ALKPHOS 44  BILITOT 0.6  PROT 7.6  ALBUMIN 4.4   Recent Labs  Lab 05/23/18 2336  LIPASE 34   No results for input(s): AMMONIA in the last 168 hours. Coagulation Profile: No results for input(s): INR, PROTIME in the last 168 hours. Cardiac Enzymes: Recent Labs  Lab 05/23/18 2336  TROPONINI <0.03   BNP (last 3 results) No results for input(s): PROBNP in the last 8760 hours. HbA1C: No results for input(s): HGBA1C in the last 72 hours. CBG: No results for input(s): GLUCAP in the last 168 hours. Lipid Profile: No results for input(s): CHOL, HDL, LDLCALC, TRIG, CHOLHDL, LDLDIRECT in the  last 72 hours. Thyroid Function Tests: No results for input(s): TSH, T4TOTAL, FREET4, T3FREE, THYROIDAB in the last 72 hours. Anemia Panel: No results for input(s): VITAMINB12, FOLATE, FERRITIN, TIBC, IRON, RETICCTPCT in the last 72 hours. Urine analysis:    Component Value Date/Time   COLORURINE YELLOW 08/12/2013 0846   APPEARANCEUR CLEAR 08/12/2013 0846   LABSPEC 1.018 08/12/2013 0846   PHURINE 7.0 08/12/2013 0846   GLUCOSEU NEGATIVE 08/12/2013 0846   HGBUR NEGATIVE 08/12/2013 0846   BILIRUBINUR NEGATIVE 08/12/2013 0846   KETONESUR NEGATIVE 08/12/2013 0846   PROTEINUR Negative 07/10/2017 1043   PROTEINUR NEGATIVE 08/12/2013 0846   UROBILINOGEN 1.0 08/12/2013 0846   NITRITE postive 07/10/2017 1043   NITRITE NEGATIVE 08/12/2013 0846   LEUKOCYTESUR Negative 07/10/2017 1043    Radiological Exams on Admission: Ct Abdomen Pelvis W Contrast  Result Date: 05/24/2018 CLINICAL DATA:  Right upper quadrant pain. Episode of vomiting. EXAM: CT ABDOMEN AND PELVIS WITH CONTRAST TECHNIQUE:  Multidetector CT imaging of the abdomen and pelvis was performed using the standard protocol following bolus administration of intravenous contrast. CONTRAST:  OMNIPAQUE IOHEXOL 300 MG/ML  SOLN COMPARISON:  Radiograph yesterday. FINDINGS: Lower chest: Subsegmental bilateral lower lobe atelectasis. There are coronary artery calcifications. Hepatobiliary: No focal hepatic abnormality. Gallstone within mildly distended gallbladder. Common bile duct is normal in caliber, there is mild intrahepatic biliary ductal dilatation. Equivocal mild pericholecystic stranding about the inferior fundus. Pancreas: No ductal dilatation or inflammation. Spleen: Normal in size without focal abnormality. Adrenals/Urinary Tract: Normal adrenal glands. No hydronephrosis or perinephric edema. Homogeneous renal enhancement with symmetric excretion on delayed phase imaging. Subcentimeter low-density in the mid right kidney is too small to  characterize. Urinary bladder is physiologically distended without wall thickening, streak artifact from bilateral hip arthroplasties partially limits evaluation. Stomach/Bowel: Small hiatal hernia. Stomach is decompressed. No small bowel wall thickening, inflammatory change, or obstruction. Normal appendix. Diverticulosis of the descending and sigmoid colon without diverticulitis. Portions of the sigmoid colon are obscured by streak artifact from hip arthroplasties. Moderate colonic stool burden. Vascular/Lymphatic: Aortic atherosclerosis without aneurysm. Portal vein and mesenteric vessels are patent. No enlarged lymph nodes in the abdomen or pelvis. Reproductive: Uterus and bilateral adnexa are unremarkable. Other: No ascites or free air. Musculoskeletal: There are no acute or suspicious osseous abnormalities. degenerative change in the spine. Bilateral hip arthroplasties. IMPRESSION: 1. Gallstones with mild gallbladder distention and equivocal pericholecystic stranding. Recommend right upper quadrant ultrasound for further evaluation. 2. Mild intrahepatic biliary ductal dilatation. Common bile duct is normal in caliber. 3. Colonic diverticulosis without diverticulitis. 4. Small hiatal hernia. 5.  Aortic Atherosclerosis (ICD10-I70.0). Electronically Signed   By: Narda Rutherford M.D.   On: 05/24/2018 01:53   Dg Chest Portable 1 View  Result Date: 05/24/2018 CLINICAL DATA:  Epigastric pain EXAM: PORTABLE CHEST 1 VIEW COMPARISON:  03/13/2016 FINDINGS: The heart size and mediastinal contours are within normal limits. Both lungs are clear. The visualized skeletal structures are unremarkable. IMPRESSION: No active disease. Electronically Signed   By: Deatra Robinson M.D.   On: 05/24/2018 00:16   Dg Abd Portable 2 Views  Result Date: 05/24/2018 CLINICAL DATA:  Epigastric pain EXAM: PORTABLE ABDOMEN - 2 VIEW COMPARISON:  None. FINDINGS: The bowel gas pattern is normal. There is no evidence of free air. No  radio-opaque calculi or other significant radiographic abnormality is seen. IMPRESSION: Negative. Electronically Signed   By: Deatra Robinson M.D.   On: 05/24/2018 00:17    EKG: Independently reviewed.  Vent. rate 59 BPM PR interval * ms QRS duration 98 ms QT/QTc 435/431 ms P-R-T axes 69 5 40 Sinus rhythm Baseline wander in lead(s) V2  Assessment/Plan Principal Problem:   RUQ pain   Gallstones Observation/telemetry. Continue IV fluids. Analgesics as needed. Antiemetics as needed. Will continue antibiotic coverage for now. RUQ [place] in a.m. General surgery evaluation.  Active Problems:   Hypokalemia Replacing. Follow-up potassium level.    Hypertension On losartan at home. Hold while getting narcotics/hypotension.    DVT prophylaxis: SCDs. Code Status: Full code. Family Communication:  Disposition Plan: Observation for RUQ ultrasound and general surgery consult. Consults called: Routine general surgery consult. Admission status: Observation/telemetry.   Bobette Mo MD [facility]  05/24/2018, 5:30 AM   This document was prepared using Dragon voice recognition software and may contain some unintended transcription errors.

## 2018-05-24 NOTE — H&P (View-Only) (Signed)
[facility] Consult  Reason for Consult: Gallstones/ Cholecystitis?  Referring Physician:  Dr. Jeannette How Dr. Gwenlyn Perking   Chief Complaint    Abdominal Pain      Sharon Chung is a 63 y.o. female.  HPI: Sharon Chung is a 63 yo with HTN and hiatal hernia who comes in with RUQ and epigastric pain that started after eating a cheese ball. She started to also have associated nausea and vomiting once at the hospital. She was seen in the ED and continued to have the pain and CT scan findings concerning for possible cholecystitis due to distention and thickening of the gallbladder. She was admitted to the hospitalist service and [place] this AM demonstrate stones and mild thickening of the gallbladder. She continues to have some pain but her nausea has improved.    She tested negative for COVID prior to admission.   Past Medical History:  Diagnosis Date  . Arthritis   . H/O hiatal hernia   . Hypertension     Past Surgical History:  Procedure Laterality Date  . JOINT REPLACEMENT Right 03/2017   Right THA.  Marland Kitchen TOTAL HIP ARTHROPLASTY Left 08/18/2013   Procedure: LEFT TOTAL HIP ARTHROPLASTY ANTERIOR APPROACH;  Surgeon: Shelda Pal, MD;  Location: [facility];  Service: Orthopedics;  Laterality: Left;  . TUBAL LIGATION      Family History  Problem Relation Age of Onset  . Heart attack Father   . Heart attack Mother   . Arthritis Mother   . Heart attack Paternal Grandfather   . Leukemia Paternal Grandmother   . Heart attack Maternal Grandmother   . Heart attack Maternal Grandfather     Social History   Tobacco Use  . Smoking status: Never Smoker  . Smokeless tobacco: Never Used  Substance Use Topics  . Alcohol use: No  . Drug use: No    Medications:  I have reviewed the patient's current medications. Prior to Admission:  Medications Prior to Admission  Medication Sig Dispense Refill Last Dose  . ALPRAZolam (XANAX) 0.5 MG tablet Take 0.25 mg by mouth at bedtime as needed.     Past Week at Unknown time  . Cholecalciferol (VITAMIN D3) 3000 UNITS TABS Take 1,000 Units by mouth. Take 1 tab daily   05/23/2018 at Unknown time  . losartan-hydrochlorothiazide (HYZAAR) 100-25 MG tablet Take 1 tablet by mouth daily.   05/23/2018 at Unknown time  . solifenacin (VESICARE) 10 MG tablet Take 1 tablet (10 mg total) by mouth daily. (Patient taking differently: Take 10 mg by mouth once a week. ) 30 tablet 11 Taking   Scheduled: . pantoprazole (PROTONIX) IV  40 mg Intravenous Q24H   Continuous: . 0.9 % NaCl with KCl 20 mEq / L 100 mL/hr at 05/24/18 0400  . cefTRIAXone (ROCEPHIN)  IV     OZH:YQMVHQIONGEXB **OR** acetaminophen, HYDROmorphone (DILAUDID) injection, ondansetron **OR** ondansetron (ZOFRAN) IV, oxyCODONE  No Known Allergies   ROS:  A comprehensive review of systems was negative except for: Gastrointestinal: positive for abdominal pain, nausea and vomiting  Blood pressure (!) 149/63, pulse (!) 57, temperature 98.3 F (36.8 C), temperature source Oral, resp. rate 16, height  (1.575 m), weight 81.7 kg, SpO2 98 %. Physical Exam Vitals signs reviewed.  Constitutional:      Appearance: She is well-developed.  HENT:     Head: Normocephalic.  Eyes:     Extraocular Movements: Extraocular movements intact.  Cardiovascular:     Rate and Rhythm: Normal rate and regular  rhythm.  Pulmonary:     Effort: Pulmonary effort is normal.  Abdominal:     General: There is no distension.     Palpations: Abdomen is soft.     Tenderness: There is abdominal tenderness in the right upper quadrant and epigastric area.  Musculoskeletal: Normal range of motion.     Comments: No swelling  Skin:    General: Skin is warm and dry.  Neurological:     General: No focal deficit present.     Mental Status: She is alert and oriented to person, place, and time.  Psychiatric:        Mood and Affect: Mood normal.        Behavior: Behavior normal.     Results: Results for orders  placed or performed during the hospital encounter of 05/23/18 (from the past 48 hour(s))  CBC with Differential/Platelet     Status: None   Collection Time: 05/23/18 11:36 PM  Result Value Ref Range   WBC 5.9 4.0 - 10.5 K/uL   RBC 4.66 3.87 - 5.11 MIL/uL   Hemoglobin 13.3 12.0 - 15.0 g/dL   HCT 16.1 09.6 - 04.5 %   MCV 85.0 80.0 - 100.0 fL   MCH 28.5 26.0 - 34.0 pg   MCHC 33.6 30.0 - 36.0 g/dL   RDW 40.9 81.1 - 91.4 %   Platelets 181 150 - 400 K/uL   nRBC 0.0 0.0 - 0.2 %   Neutrophils Relative % 77 %   Neutro Abs 4.6 1.7 - 7.7 K/uL   Lymphocytes Relative 18 %   Lymphs Abs 1.0 0.7 - 4.0 K/uL   Monocytes Relative 4 %   Monocytes Absolute 0.2 0.1 - 1.0 K/uL   Eosinophils Relative 0 %   Eosinophils Absolute 0.0 0.0 - 0.5 K/uL   Basophils Relative 0 %   Basophils Absolute 0.0 0.0 - 0.1 K/uL   Immature Granulocytes 1 %   Abs Immature Granulocytes 0.03 0.00 - 0.07 K/uL    Comment: Performed at [facility], [address], [place], [place] [address]  Comprehensive metabolic panel     Status: Abnormal   Collection Time: 05/23/18 11:36 PM  Result Value Ref Range   Sodium 138 135 - 145 mmol/L   Potassium 3.3 (L) 3.5 - 5.1 mmol/L   Chloride 103 98 - 111 mmol/L   CO2 21 (L) 22 - 32 mmol/L   Glucose, Bld 154 (H) 70 - 99 mg/dL   BUN 29 (H) 8 - 23 mg/dL   Creatinine, Ser 6.21 0.44 - 1.00 mg/dL   Calcium 9.5 8.9 - 30.8 mg/dL   Total Protein 7.6 6.5 - 8.1 g/dL   Albumin 4.4 3.5 - 5.0 g/dL   AST 23 15 - 41 U/L   ALT 20 0 - 44 U/L   Alkaline Phosphatase 44 38 - 126 U/L   Total Bilirubin 0.6 0.3 - 1.2 mg/dL   GFR calc non Af Amer >60 >60 mL/min   GFR calc Af Amer >60 >60 mL/min   Anion gap 14 5 - 15    Comment: Performed at [facility], [address], [place], [place] [address]  Lipase, blood     Status: None   Collection Time: 05/23/18 11:36 PM  Result Value Ref Range   Lipase 34 11 - 51 U/L    Comment: Performed at [facility], [address] [place], [place] [address]  Troponin I  - ONCE - STAT     Status: None  Collection Time: 05/23/18 11:36 PM  Result Value Ref Range   Troponin I <0.03 <0.03 ng/mL    Comment: Performed at [facility], [address] [place], [place] [address]  SARS Coronavirus 2 (CEPHEID - Performed in [facility] hospital lab), Hosp Order     Status: None   Collection Time: 05/24/18  2:08 AM  Result Value Ref Range   SARS Coronavirus 2 NEGATIVE NEGATIVE    Comment: (NOTE) If result is NEGATIVE SARS-CoV-2 target nucleic acids are NOT DETECTED. The SARS-CoV-2 RNA is generally detectable in upper and lower  respiratory specimens during the acute phase of infection. The lowest  concentration of SARS-CoV-2 viral copies this assay can detect is 250  copies / mL. A negative result does not preclude SARS-CoV-2 infection  and should not be used as the sole basis for treatment or other  patient management decisions.  A negative result may occur with  improper specimen collection / handling, submission of specimen other  than nasopharyngeal swab, presence of viral mutation(s) within the  areas targeted by this assay, and inadequate number of viral copies  (<250 copies / mL). A negative result must be combined with clinical  observations, patient history, and epidemiological information. If result is POSITIVE SARS-CoV-2 target nucleic acids are DETECTED. The SARS-CoV-2 RNA is generally detectable in upper and lower  respiratory specimens dur ing the acute phase of infection.  Positive  results are indicative of active infection with SARS-CoV-2.  Clinical  correlation with patient history and other diagnostic information is  necessary to determine patient infection status.  Positive results do  not rule out bacterial infection or co-infection with other viruses. If result is PRESUMPTIVE POSTIVE SARS-CoV-2 nucleic acids MAY BE PRESENT.   A presumptive positive result was obtained on the submitted specimen  and confirmed on repeat testing.  While  2019 novel coronavirus  (SARS-CoV-2) nucleic acids may be present in the submitted sample  additional confirmatory testing may be necessary for epidemiological  and / or clinical management purposes  to differentiate between  SARS-CoV-2 and other Sarbecovirus currently known to infect humans.  If clinically indicated additional testing with an alternate test  methodology [phone]) is advised. The SARS-CoV-2 RNA is generally  detectable in upper and lower respiratory sp ecimens during the acute  phase of infection. The expected result is Negative. Fact Sheet for Patients:  BoilerBrush.com.cy Fact Sheet for Healthcare Providers: https://pope.com/ This test is not yet approved or cleared by the [place] FDA and has been authorized for detection and/or diagnosis of SARS-CoV-2 by FDA under an Emergency Use Authorization (EUA).  This EUA will remain in effect (meaning this test can be used) for the duration of the COVID-19 declaration under Section 564(b)(1) of the Act, 21 U.S.C. section 360bbb-3(b)(1), unless the authorization is terminated or revoked sooner. Performed at [facility], [address] [place], [place] [address]   Magnesium     Status: None   Collection Time: 05/24/18  8:07 AM  Result Value Ref Range   Magnesium 2.1 1.7 - 2.4 mg/dL    Comment: Performed at [facility], [address], [place], [place] [address]  Phosphorus     Status: None   Collection Time: 05/24/18  8:07 AM  Result Value Ref Range   Phosphorus 3.7 2.5 - 4.6 mg/dL    Comment: Performed at [facility], [address] [place], [place] [address]  CBC WITH DIFFERENTIAL     Status: None   Collection Time: 05/24/18  8:07  AM  Result Value Ref Range   WBC 8.1 4.0 - 10.5 K/uL   RBC 4.46 3.87 - 5.11 MIL/uL   Hemoglobin 12.8 12.0 - 15.0 g/dL   HCT 15.1 76.1 - 60.7 %   MCV 86.8 80.0 - 100.0 fL   MCH 28.7 26.0 - 34.0 pg   MCHC 33.1 30.0 - 36.0 g/dL   RDW  37.1 06.2 - 69.4 %   Platelets 176 150 - 400 K/uL   nRBC 0.0 0.0 - 0.2 %   Neutrophils Relative % 82 %   Neutro Abs 6.6 1.7 - 7.7 K/uL   Lymphocytes Relative 13 %   Lymphs Abs 1.1 0.7 - 4.0 K/uL   Monocytes Relative 5 %   Monocytes Absolute 0.4 0.1 - 1.0 K/uL   Eosinophils Relative 0 %   Eosinophils Absolute 0.0 0.0 - 0.5 K/uL   Basophils Relative 0 %   Basophils Absolute 0.0 0.0 - 0.1 K/uL   Immature Granulocytes 0 %   Abs Immature Granulocytes 0.03 0.00 - 0.07 K/uL    Comment: Performed at [facility], [address], [place], [place] [address]  Comprehensive metabolic panel     Status: Abnormal   Collection Time: 05/24/18  8:07 AM  Result Value Ref Range   Sodium 139 135 - 145 mmol/L   Potassium 3.7 3.5 - 5.1 mmol/L   Chloride 104 98 - 111 mmol/L   CO2 25 22 - 32 mmol/L   Glucose, Bld 119 (H) 70 - 99 mg/dL   BUN 20 8 - 23 mg/dL   Creatinine, Ser 7.03 0.44 - 1.00 mg/dL   Calcium 8.5 (L) 8.9 - 10.3 mg/dL   Total Protein 7.1 6.5 - 8.1 g/dL   Albumin 3.9 3.5 - 5.0 g/dL   AST 21 15 - 41 U/L   ALT 19 0 - 44 U/L   Alkaline Phosphatase 48 38 - 126 U/L   Total Bilirubin 0.6 0.3 - 1.2 mg/dL   GFR calc non Af Amer >60 >60 mL/min   GFR calc Af Amer >60 >60 mL/min   Anion gap 10 5 - 15    Comment: Performed at [facility], [address] [place], [place] [address]   Personally reviewed- distended gallbladder with some stones, no biliary dilation  Ct Abdomen Pelvis W Contrast  Result Date: 05/24/2018 CLINICAL DATA:  Right upper quadrant pain. Episode of vomiting. EXAM: CT ABDOMEN AND PELVIS WITH CONTRAST TECHNIQUE: Multidetector CT imaging of the abdomen and pelvis was performed using the standard protocol following bolus administration of intravenous contrast. CONTRAST:  OMNIPAQUE IOHEXOL 300 MG/ML  SOLN COMPARISON:  Radiograph yesterday. FINDINGS: Lower chest: Subsegmental bilateral lower lobe atelectasis. There are coronary artery calcifications. Hepatobiliary: No focal  hepatic abnormality. Gallstone within mildly distended gallbladder. Common bile duct is normal in caliber, there is mild intrahepatic biliary ductal dilatation. Equivocal mild pericholecystic stranding about the inferior fundus. Pancreas: No ductal dilatation or inflammation. Spleen: Normal in size without focal abnormality. Adrenals/Urinary Tract: Normal adrenal glands. No hydronephrosis or perinephric edema. Homogeneous renal enhancement with symmetric excretion on delayed phase imaging. Subcentimeter low-density in the mid right kidney is too small to characterize. Urinary bladder is physiologically distended without wall thickening, streak artifact from bilateral hip arthroplasties partially limits evaluation. Stomach/Bowel: Small hiatal hernia. Stomach is decompressed. No small bowel wall thickening, inflammatory change, or obstruction. Normal appendix. Diverticulosis of the descending and sigmoid colon without diverticulitis. Portions of the sigmoid colon are obscured by streak artifact from hip arthroplasties. Moderate  colonic stool burden. Vascular/Lymphatic: Aortic atherosclerosis without aneurysm. Portal vein and mesenteric vessels are patent. No enlarged lymph nodes in the abdomen or pelvis. Reproductive: Uterus and bilateral adnexa are unremarkable. Other: No ascites or free air. Musculoskeletal: There are no acute or suspicious osseous abnormalities. degenerative change in the spine. Bilateral hip arthroplasties. IMPRESSION: 1. Gallstones with mild gallbladder distention and equivocal pericholecystic stranding. Recommend right upper quadrant ultrasound for further evaluation. 2. Mild intrahepatic biliary ductal dilatation. Common bile duct is normal in caliber. 3. Colonic diverticulosis without diverticulitis. 4. Small hiatal hernia. 5.  Aortic Atherosclerosis (ICD10-I70.0). Electronically Signed   By: Narda RutherfordMelanie  Sanford M.D.   On: 05/24/2018 01:53   Dg Chest Portable 1 View  Result Date:  05/24/2018 CLINICAL DATA:  Epigastric pain EXAM: PORTABLE CHEST 1 VIEW COMPARISON:  03/13/2016 FINDINGS: The heart size and mediastinal contours are within normal limits. Both lungs are clear. The visualized skeletal structures are unremarkable. IMPRESSION: No active disease. Electronically Signed   By: Deatra RobinsonKevin  Herman M.D.   On: 05/24/2018 00:16   Dg Abd Portable 2 Views  Result Date: 05/24/2018 CLINICAL DATA:  Epigastric pain EXAM: PORTABLE ABDOMEN - 2 VIEW COMPARISON:  None. FINDINGS: The bowel gas pattern is normal. There is no evidence of free air. No radio-opaque calculi or other significant radiographic abnormality is seen. IMPRESSION: Negative. Electronically Signed   By: Deatra RobinsonKevin  Herman M.D.   On: 05/24/2018 00:17   [place] Abdomen Limited Ruq  Result Date: 05/24/2018 CLINICAL DATA:  Right upper quadrant pain x1 day, nausea/vomiting EXAM: ULTRASOUND ABDOMEN LIMITED RIGHT UPPER QUADRANT COMPARISON:  CT abdomen/pelvis dated 05/24/2018 FINDINGS: Gallbladder: Layering gallstones with gallbladder sludge. Very mild gallbladder wall thickening with trace pericholecystic fluid. Common bile duct: Diameter: 3 mm Liver: Coarse hepatic echotexture. No focal hepatic lesion is seen. Portal vein is patent on color Doppler imaging with normal direction of blood flow towards the liver. IMPRESSION: Cholelithiasis, with mild gallbladder wall thickening/edema, but no convincing findings to suggest acute cholecystitis. If there is continued clinical concern, consider hepatobiliary nuclear medicine scan. Electronically Signed   By: Charline BillsSriyesh  Krishnan M.D.   On: 05/24/2018 12:01    Assessment & Plan:  Sharon Chung is a 63 y.o. female with cholelithiasis and possibly some thickening of the gallbladder with increasing WBC with shift in the setting of antibiotics. She could be developing some cholecystitis.    -Changed to my service -PRN for pain, Roxi added -Full liquids for now -Plan for OR Monday 5/18 -Consent  obtained -Rocephin for cholecystitis and repeat CBC in the AM, given the rise and shift while on antibiotics from 5.9 to 8.1 (77 to 82)   All questions were answered to the satisfaction of the patient.  Sharon Chung 05/24/2018, 1:03 PM

## 2018-05-24 NOTE — Consult Note (Signed)
[facility] Consult  Reason for Consult: Gallstones/ Cholecystitis?  Referring Physician:  Dr. Oritz/ Dr. Madera   Chief Complaint    Abdominal Pain      Sharon Chung is a 63 y.o. female.  HPI: Sharon Chung is a 63 yo with HTN and hiatal hernia who comes in with RUQ and epigastric pain that started after eating a cheese ball. Sharon Chung started to also have associated nausea and vomiting once at the hospital. Sharon Chung was seen in the ED and continued to have the pain and CT scan findings concerning for possible cholecystitis due to distention and thickening of the gallbladder. Sharon Chung was admitted to the hospitalist service and US this AM demonstrate stones and mild thickening of the gallbladder. Sharon Chung continues to have some pain but her nausea has improved.    Sharon Chung tested negative for COVID prior to admission.   Past Medical History:  Diagnosis Date  . Arthritis   . H/O hiatal hernia   . Hypertension     Past Surgical History:  Procedure Laterality Date  . JOINT REPLACEMENT Right 03/2017   Right THA.  . TOTAL HIP ARTHROPLASTY Left 08/18/2013   Procedure: LEFT TOTAL HIP ARTHROPLASTY ANTERIOR APPROACH;  Surgeon: Matthew D Olin, MD;  Location: [facility];  Service: Orthopedics;  Laterality: Left;  . TUBAL LIGATION      Family History  Problem Relation Age of Onset  . Heart attack Father   . Heart attack Mother   . Arthritis Mother   . Heart attack Paternal Grandfather   . Leukemia Paternal Grandmother   . Heart attack Maternal Grandmother   . Heart attack Maternal Grandfather     Social History   Tobacco Use  . Smoking status: Never Smoker  . Smokeless tobacco: Never Used  Substance Use Topics  . Alcohol use: No  . Drug use: No    Medications:  I have reviewed the patient's current medications. Prior to Admission:  Medications Prior to Admission  Medication Sig Dispense Refill Last Dose  . ALPRAZolam (XANAX) 0.5 MG tablet Take 0.25 mg by mouth at bedtime as needed.     Past Week at Unknown time  . Cholecalciferol (VITAMIN D3) 3000 UNITS TABS Take 1,000 Units by mouth. Take 1 tab daily   05/23/2018 at Unknown time  . losartan-hydrochlorothiazide (HYZAAR) 100-25 MG tablet Take 1 tablet by mouth daily.   05/23/2018 at Unknown time  . solifenacin (VESICARE) 10 MG tablet Take 1 tablet (10 mg total) by mouth daily. (Patient taking differently: Take 10 mg by mouth once a week. ) 30 tablet 11 Taking   Scheduled: . pantoprazole (PROTONIX) IV  40 mg Intravenous Q24H   Continuous: . 0.9 % NaCl with KCl 20 mEq / L 100 mL/hr at 05/24/18 0400  . cefTRIAXone (ROCEPHIN)  IV     PRN:acetaminophen **OR** acetaminophen, HYDROmorphone (DILAUDID) injection, ondansetron **OR** ondansetron (ZOFRAN) IV, oxyCODONE  No Known Allergies   ROS:  A comprehensive review of systems was negative except for: Gastrointestinal: positive for abdominal pain, nausea and vomiting  Blood pressure (!) 149/63, pulse (!) 57, temperature 98.3 F (36.8 C), temperature source Oral, resp. rate 16, height 5' 2" (1.575 m), weight 81.7 kg, SpO2 98 %. Physical Exam Vitals signs reviewed.  Constitutional:      Appearance: Sharon Chung is well-developed.  HENT:     Head: Normocephalic.  Eyes:     Extraocular Movements: Extraocular movements intact.  Cardiovascular:     Rate and Rhythm: Normal rate and regular   rhythm.  Pulmonary:     Effort: Pulmonary effort is normal.  Abdominal:     General: There is no distension.     Palpations: Abdomen is soft.     Tenderness: There is abdominal tenderness in the right upper quadrant and epigastric area.  Musculoskeletal: Normal range of motion.     Comments: No swelling  Skin:    General: Skin is warm and dry.  Neurological:     General: No focal deficit present.     Mental Status: Sharon Chung is alert and oriented to person, place, and time.  Psychiatric:        Mood and Affect: Mood normal.        Behavior: Behavior normal.     Results: Results for orders  placed or performed during the hospital encounter of 05/23/18 (from the past 48 hour(s))  CBC with Differential/Platelet     Status: None   Collection Time: 05/23/18 11:36 PM  Result Value Ref Range   WBC 5.9 4.0 - 10.5 K/uL   RBC 4.66 3.87 - 5.11 MIL/uL   Hemoglobin 13.3 12.0 - 15.0 g/dL   HCT 39.6 36.0 - 46.0 %   MCV 85.0 80.0 - 100.0 fL   MCH 28.5 26.0 - 34.0 pg   MCHC 33.6 30.0 - 36.0 g/dL   RDW 13.1 11.5 - 15.5 %   Platelets 181 150 - 400 K/uL   nRBC 0.0 0.0 - 0.2 %   Neutrophils Relative % 77 %   Neutro Abs 4.6 1.7 - 7.7 K/uL   Lymphocytes Relative 18 %   Lymphs Abs 1.0 0.7 - 4.0 K/uL   Monocytes Relative 4 %   Monocytes Absolute 0.2 0.1 - 1.0 K/uL   Eosinophils Relative 0 %   Eosinophils Absolute 0.0 0.0 - 0.5 K/uL   Basophils Relative 0 %   Basophils Absolute 0.0 0.0 - 0.1 K/uL   Immature Granulocytes 1 %   Abs Immature Granulocytes 0.03 0.00 - 0.07 K/uL    Comment: Performed at [facility], [address] [place], [place] [address]  Comprehensive metabolic panel     Status: Abnormal   Collection Time: 05/23/18 11:36 PM  Result Value Ref Range   Sodium 138 135 - 145 mmol/L   Potassium 3.3 (L) 3.5 - 5.1 mmol/L   Chloride 103 98 - 111 mmol/L   CO2 21 (L) 22 - 32 mmol/L   Glucose, Bld 154 (H) 70 - 99 mg/dL   BUN 29 (H) 8 - 23 mg/dL   Creatinine, Ser 0.77 0.44 - 1.00 mg/dL   Calcium 9.5 8.9 - 10.3 mg/dL   Total Protein 7.6 6.5 - 8.1 g/dL   Albumin 4.4 3.5 - 5.0 g/dL   AST 23 15 - 41 U/L   ALT 20 0 - 44 U/L   Alkaline Phosphatase 44 38 - 126 U/L   Total Bilirubin 0.6 0.3 - 1.2 mg/dL   GFR calc non Af Amer >60 >60 mL/min   GFR calc Af Amer >60 >60 mL/min   Anion gap 14 5 - 15    Comment: Performed at [facility], [address] [place], [place] [address]  Lipase, blood     Status: None   Collection Time: 05/23/18 11:36 PM  Result Value Ref Range   Lipase 34 11 - 51 U/L    Comment: Performed at [facility], [address] [place], [place] [address]  Troponin I  - ONCE - STAT     Status: None     Collection Time: 05/23/18 11:36 PM  Result Value Ref Range   Troponin I <0.03 <0.03 ng/mL    Comment: Performed at [facility], [address] [place], [place] [address]  SARS Coronavirus 2 (CEPHEID - Performed in [place] hospital lab), Hosp Order     Status: None   Collection Time: 05/24/18  2:08 AM  Result Value Ref Range   SARS Coronavirus 2 NEGATIVE NEGATIVE    Comment: (NOTE) If result is NEGATIVE SARS-CoV-2 target nucleic acids are NOT DETECTED. The SARS-CoV-2 RNA is generally detectable in upper and lower  respiratory specimens during the acute phase of infection. The lowest  concentration of SARS-CoV-2 viral copies this assay can detect is 250  copies / mL. A negative result does not preclude SARS-CoV-2 infection  and should not be used as the sole basis for treatment or other  patient management decisions.  A negative result may occur with  improper specimen collection / handling, submission of specimen other  than nasopharyngeal swab, presence of viral mutation(s) within the  areas targeted by this assay, and inadequate number of viral copies  (<250 copies / mL). A negative result must be combined with clinical  observations, patient history, and epidemiological information. If result is POSITIVE SARS-CoV-2 target nucleic acids are DETECTED. The SARS-CoV-2 RNA is generally detectable in upper and lower  respiratory specimens dur ing the acute phase of infection.  Positive  results are indicative of active infection with SARS-CoV-2.  Clinical  correlation with patient history and other diagnostic information is  necessary to determine patient infection status.  Positive results do  not rule out bacterial infection or co-infection with other viruses. If result is PRESUMPTIVE POSTIVE SARS-CoV-2 nucleic acids MAY BE PRESENT.   A presumptive positive result was obtained on the submitted specimen  and confirmed on repeat testing.  While  2019 novel coronavirus  (SARS-CoV-2) nucleic acids may be present in the submitted sample  additional confirmatory testing may be necessary for epidemiological  and / or clinical management purposes  to differentiate between  SARS-CoV-2 and other Sarbecovirus currently known to infect humans.  If clinically indicated additional testing with an alternate test  methodology ([id number]) is advised. The SARS-CoV-2 RNA is generally  detectable in upper and lower respiratory sp ecimens during the acute  phase of infection. The expected result is Negative. Fact Sheet for Patients:  https://www.fda.gov/media/136312/download Fact Sheet for Healthcare Providers: https://www.fda.gov/media/136313/download This test is not yet approved or cleared by the [place] FDA and has been authorized for detection and/or diagnosis of SARS-CoV-2 by FDA under an Emergency Use Authorization (EUA).  This EUA will remain in effect (meaning this test can be used) for the duration of the COVID-19 declaration under Section 564(b)(1) of the Act, 21 U.S.C. section 360bbb-3(b)(1), unless the authorization is terminated or revoked sooner. Performed at [facility], [address] [place], [place] [address]   Magnesium     Status: None   Collection Time: 05/24/18  8:07 AM  Result Value Ref Range   Magnesium 2.1 1.7 - 2.4 mg/dL    Comment: Performed at [facility], [address] [place], [place] [address]  Phosphorus     Status: None   Collection Time: 05/24/18  8:07 AM  Result Value Ref Range   Phosphorus 3.7 2.5 - 4.6 mg/dL    Comment: Performed at [facility], [address] [place],  [address]  CBC WITH DIFFERENTIAL     Status: None   Collection Time: 05/24/18  8:07   AM  Result Value Ref Range   WBC 8.1 4.0 - 10.5 K/uL   RBC 4.46 3.87 - 5.11 MIL/uL   Hemoglobin 12.8 12.0 - 15.0 g/dL   HCT 38.7 36.0 - 46.0 %   MCV 86.8 80.0 - 100.0 fL   MCH 28.7 26.0 - 34.0 pg   MCHC 33.1 30.0 - 36.0 g/dL   RDW  13.0 11.5 - 15.5 %   Platelets 176 150 - 400 K/uL   nRBC 0.0 0.0 - 0.2 %   Neutrophils Relative % 82 %   Neutro Abs 6.6 1.7 - 7.7 K/uL   Lymphocytes Relative 13 %   Lymphs Abs 1.1 0.7 - 4.0 K/uL   Monocytes Relative 5 %   Monocytes Absolute 0.4 0.1 - 1.0 K/uL   Eosinophils Relative 0 %   Eosinophils Absolute 0.0 0.0 - 0.5 K/uL   Basophils Relative 0 %   Basophils Absolute 0.0 0.0 - 0.1 K/uL   Immature Granulocytes 0 %   Abs Immature Granulocytes 0.03 0.00 - 0.07 K/uL    Comment: Performed at [facility], [address] [place], [place] [address]  Comprehensive metabolic panel     Status: Abnormal   Collection Time: 05/24/18  8:07 AM  Result Value Ref Range   Sodium 139 135 - 145 mmol/L   Potassium 3.7 3.5 - 5.1 mmol/L   Chloride 104 98 - 111 mmol/L   CO2 25 22 - 32 mmol/L   Glucose, Bld 119 (H) 70 - 99 mg/dL   BUN 20 8 - 23 mg/dL   Creatinine, Ser 0.65 0.44 - 1.00 mg/dL   Calcium 8.5 (L) 8.9 - 10.3 mg/dL   Total Protein 7.1 6.5 - 8.1 g/dL   Albumin 3.9 3.5 - 5.0 g/dL   AST 21 15 - 41 U/L   ALT 19 0 - 44 U/L   Alkaline Phosphatase 48 38 - 126 U/L   Total Bilirubin 0.6 0.3 - 1.2 mg/dL   GFR calc non Af Amer >60 >60 mL/min   GFR calc Af Amer >60 >60 mL/min   Anion gap 10 5 - 15    Comment: Performed at  Hospital, [address] [place], [place] [address]   Personally reviewed- distended gallbladder with some stones, no biliary dilation  Ct Abdomen Pelvis W Contrast  Result Date: 05/24/2018 CLINICAL DATA:  Right upper quadrant pain. Episode of vomiting. EXAM: CT ABDOMEN AND PELVIS WITH CONTRAST TECHNIQUE: Multidetector CT imaging of the abdomen and pelvis was performed using the standard protocol following bolus administration of intravenous contrast. CONTRAST:  100mL OMNIPAQUE IOHEXOL 300 MG/ML  SOLN COMPARISON:  Radiograph yesterday. FINDINGS: Lower chest: Subsegmental bilateral lower lobe atelectasis. There are coronary artery calcifications. Hepatobiliary: No focal  hepatic abnormality. Gallstone within mildly distended gallbladder. Common bile duct is normal in caliber, there is mild intrahepatic biliary ductal dilatation. Equivocal mild pericholecystic stranding about the inferior fundus. Pancreas: No ductal dilatation or inflammation. Spleen: Normal in size without focal abnormality. Adrenals/Urinary Tract: Normal adrenal glands. No hydronephrosis or perinephric edema. Homogeneous renal enhancement with symmetric excretion on delayed phase imaging. Subcentimeter low-density in the mid right kidney is too small to characterize. Urinary bladder is physiologically distended without wall thickening, streak artifact from bilateral hip arthroplasties partially limits evaluation. Stomach/Bowel: Small hiatal hernia. Stomach is decompressed. No small bowel wall thickening, inflammatory change, or obstruction. Normal appendix. Diverticulosis of the descending and sigmoid colon without diverticulitis. Portions of the sigmoid colon are obscured by streak artifact from hip arthroplasties. Moderate   colonic stool burden. Vascular/Lymphatic: Aortic atherosclerosis without aneurysm. Portal vein and mesenteric vessels are patent. No enlarged lymph nodes in the abdomen or pelvis. Reproductive: Uterus and bilateral adnexa are unremarkable. Other: No ascites or free air. Musculoskeletal: There are no acute or suspicious osseous abnormalities. degenerative change in the spine. Bilateral hip arthroplasties. IMPRESSION: 1. Gallstones with mild gallbladder distention and equivocal pericholecystic stranding. Recommend right upper quadrant ultrasound for further evaluation. 2. Mild intrahepatic biliary ductal dilatation. Common bile duct is normal in caliber. 3. Colonic diverticulosis without diverticulitis. 4. Small hiatal hernia. 5.  Aortic Atherosclerosis (ICD10-I70.0). Electronically Signed   By: Melanie  Sanford M.D.   On: 05/24/2018 01:53   Dg Chest Portable 1 View  Result Date:  05/24/2018 CLINICAL DATA:  Epigastric pain EXAM: PORTABLE CHEST 1 VIEW COMPARISON:  03/13/2016 FINDINGS: The heart size and mediastinal contours are within normal limits. Both lungs are clear. The visualized skeletal structures are unremarkable. IMPRESSION: No active disease. Electronically Signed   By: Kevin  Herman M.D.   On: 05/24/2018 00:16   Dg Abd Portable 2 Views  Result Date: 05/24/2018 CLINICAL DATA:  Epigastric pain EXAM: PORTABLE ABDOMEN - 2 VIEW COMPARISON:  None. FINDINGS: The bowel gas pattern is normal. There is no evidence of free air. No radio-opaque calculi or other significant radiographic abnormality is seen. IMPRESSION: Negative. Electronically Signed   By: Kevin  Herman M.D.   On: 05/24/2018 00:17   Us Abdomen Limited Ruq  Result Date: 05/24/2018 CLINICAL DATA:  Right upper quadrant pain x1 day, nausea/vomiting EXAM: ULTRASOUND ABDOMEN LIMITED RIGHT UPPER QUADRANT COMPARISON:  CT abdomen/pelvis dated 05/24/2018 FINDINGS: Gallbladder: Layering gallstones with gallbladder sludge. Very mild gallbladder wall thickening with trace pericholecystic fluid. Common bile duct: Diameter: 3 mm Liver: Coarse hepatic echotexture. No focal hepatic lesion is seen. Portal vein is patent on color Doppler imaging with normal direction of blood flow towards the liver. IMPRESSION: Cholelithiasis, with mild gallbladder wall thickening/edema, but no convincing findings to suggest acute cholecystitis. If there is continued clinical concern, consider hepatobiliary nuclear medicine scan. Electronically Signed   By: Sriyesh  Krishnan M.D.   On: 05/24/2018 12:01    Assessment & Plan:  Kamali Straw is a 63 y.o. female with cholelithiasis and possibly some thickening of the gallbladder with increasing WBC with shift in the setting of antibiotics. Sharon Chung could be developing some cholecystitis.    -Changed to my service -PRN for pain, Roxi added -Full liquids for now -Plan for OR Monday 5/18 -Consent  obtained -Rocephin for cholecystitis and repeat CBC in the AM, given the rise and shift while on antibiotics from 5.9 to 8.1 (77 to 82)   All questions were answered to the satisfaction of the patient.  Amarra Sawyer C Rinnah Peppel 05/24/2018, 1:03 PM       

## 2018-05-24 NOTE — Progress Notes (Signed)
Patient seen and examined.  Admitted after midnight secondary to abdominal pain, nausea and vomiting.  Patient symptoms suggested gallbladder dysfunction; as she had experience right upper quadrant pain that was worse while eating. CT abdomen and pelvis demonstrated gallstones with mild gallbladder distention and equivocal pericholecystic stranding.  Patient is afebrile and normal WBCs.  Normal lipase and normal LFTs appreciated on exam.  She has remained hemodynamically stable.  Please refer to H&P written by Dr. Robb Matar for further info/details on admission.  Plan -Follow right upper quadrant ultrasound -Continue the use of antiemetics and analgesics -Patient has been empirically started on prophylactic antibiotic -Most likely suffering of cholelithiasis with biliary colic -Given normal LFTs, no fever and no elevation of her WBCs will doubt cholecystitis and choledocholithiasis. -General surgery has been consulted and will follow recommendations.  Vassie Loll MD [phone]

## 2018-05-24 NOTE — ED Notes (Signed)
Pt requesting additional pain medication, reports that the pain is getting worse again,

## 2018-05-25 DIAGNOSIS — K8020 Calculus of gallbladder without cholecystitis without obstruction: Secondary | ICD-10-CM | POA: Diagnosis not present

## 2018-05-25 DIAGNOSIS — K819 Cholecystitis, unspecified: Secondary | ICD-10-CM | POA: Diagnosis not present

## 2018-05-25 LAB — CBC WITH DIFFERENTIAL/PLATELET
Abs Immature Granulocytes: 0.03 10*3/uL (ref 0.00–0.07)
Basophils Absolute: 0 10*3/uL (ref 0.0–0.1)
Basophils Relative: 0 %
Eosinophils Absolute: 0.1 10*3/uL (ref 0.0–0.5)
Eosinophils Relative: 2 %
HCT: 37.7 % (ref 36.0–46.0)
Hemoglobin: 12.3 g/dL (ref 12.0–15.0)
Immature Granulocytes: 1 %
Lymphocytes Relative: 30 %
Lymphs Abs: 1.9 10*3/uL (ref 0.7–4.0)
MCH: 28.8 pg (ref 26.0–34.0)
MCHC: 32.6 g/dL (ref 30.0–36.0)
MCV: 88.3 fL (ref 80.0–100.0)
Monocytes Absolute: 0.5 10*3/uL (ref 0.1–1.0)
Monocytes Relative: 8 %
Neutro Abs: 3.7 10*3/uL (ref 1.7–7.7)
Neutrophils Relative %: 59 %
Platelets: 156 10*3/uL (ref 150–400)
RBC: 4.27 MIL/uL (ref 3.87–5.11)
RDW: 13.3 % (ref 11.5–15.5)
WBC: 6.2 10*3/uL (ref 4.0–10.5)
nRBC: 0 % (ref 0.0–0.2)

## 2018-05-25 MED ORDER — CHLORHEXIDINE GLUCONATE CLOTH 2 % EX PADS
6.0000 | MEDICATED_PAD | Freq: Once | CUTANEOUS | Status: AC
  Administered 2018-05-25: 6 via TOPICAL

## 2018-05-25 MED ORDER — ENOXAPARIN SODIUM 40 MG/0.4ML ~~LOC~~ SOLN
40.0000 mg | SUBCUTANEOUS | Status: DC
  Administered 2018-05-25: 40 mg via SUBCUTANEOUS
  Filled 2018-05-25: qty 0.4

## 2018-05-25 MED ORDER — SODIUM CHLORIDE 0.9 % IV SOLN
2.0000 g | INTRAVENOUS | Status: AC
  Administered 2018-05-26: 2 g via INTRAVENOUS
  Filled 2018-05-25 (×2): qty 2

## 2018-05-25 MED ORDER — CHLORHEXIDINE GLUCONATE CLOTH 2 % EX PADS
6.0000 | MEDICATED_PAD | Freq: Once | CUTANEOUS | Status: AC
  Administered 2018-05-26: 06:00:00 6 via TOPICAL

## 2018-05-25 NOTE — Progress Notes (Signed)
[facility] Progress Note     Subjective: No complaints. Pain improving with antibiotics. Makes me lean more towards cholecystitis.  WBC coming down. Tolerated fulls.   Objective: Vital signs in last 24 hours: Temp:  [98.3 F (36.8 C)-98.6 F (37 C)] 98.6 F (37 C) (05/17 0522) Pulse Rate:  [54-58] 58 (05/17 0522) Resp:  [16-20] 20 (05/17 0522) BP: (92-123)/(43-64) 123/64 (05/17 0522) SpO2:  [93 %-98 %] 98 % (05/17 0801) Last BM Date: 05/23/18  Intake/Output from previous day: 05/16 0701 - 05/17 0700 In: 2376.6 [P.O.:1080; I.V.:1205.8; IV Piggyback:90.8] Out: 800 [Urine:800] Intake/Output this shift: Total I/O In: 360 [P.O.:360] Out: 450 [Urine:450]  General appearance: alert, cooperative and no distress Resp: normal work of breathing GI: soft, nondistended, minimally tender RUQ  Lab Results:  Recent Labs    05/24/18 0807 05/25/18 0450  WBC 8.1 6.2  HGB 12.8 12.3  HCT 38.7 37.7  PLT 176 156   BMET Recent Labs    05/23/18 2336 05/24/18 0807  NA 138 139  K 3.3* 3.7  CL 103 104  CO2 21* 25  GLUCOSE 154* 119*  BUN 29* 20  CREATININE 0.77 0.65  CALCIUM 9.5 8.5*    Studies/Results: Ct Abdomen Pelvis W Contrast  Result Date: 05/24/2018 CLINICAL DATA:  Right upper quadrant pain. Episode of vomiting. EXAM: CT ABDOMEN AND PELVIS WITH CONTRAST TECHNIQUE: Multidetector CT imaging of the abdomen and pelvis was performed using the standard protocol following bolus administration of intravenous contrast. CONTRAST:  OMNIPAQUE IOHEXOL 300 MG/ML  SOLN COMPARISON:  Radiograph yesterday. FINDINGS: Lower chest: Subsegmental bilateral lower lobe atelectasis. There are coronary artery calcifications. Hepatobiliary: No focal hepatic abnormality. Gallstone within mildly distended gallbladder. Common bile duct is normal in caliber, there is mild intrahepatic biliary ductal dilatation. Equivocal mild pericholecystic stranding about the inferior fundus.  Pancreas: No ductal dilatation or inflammation. Spleen: Normal in size without focal abnormality. Adrenals/Urinary Tract: Normal adrenal glands. No hydronephrosis or perinephric edema. Homogeneous renal enhancement with symmetric excretion on delayed phase imaging. Subcentimeter low-density in the mid right kidney is too small to characterize. Urinary bladder is physiologically distended without wall thickening, streak artifact from bilateral hip arthroplasties partially limits evaluation. Stomach/Bowel: Small hiatal hernia. Stomach is decompressed. No small bowel wall thickening, inflammatory change, or obstruction. Normal appendix. Diverticulosis of the descending and sigmoid colon without diverticulitis. Portions of the sigmoid colon are obscured by streak artifact from hip arthroplasties. Moderate colonic stool burden. Vascular/Lymphatic: Aortic atherosclerosis without aneurysm. Portal vein and mesenteric vessels are patent. No enlarged lymph nodes in the abdomen or pelvis. Reproductive: Uterus and bilateral adnexa are unremarkable. Other: No ascites or free air. Musculoskeletal: There are no acute or suspicious osseous abnormalities. degenerative change in the spine. Bilateral hip arthroplasties. IMPRESSION: 1. Gallstones with mild gallbladder distention and equivocal pericholecystic stranding. Recommend right upper quadrant ultrasound for further evaluation. 2. Mild intrahepatic biliary ductal dilatation. Common bile duct is normal in caliber. 3. Colonic diverticulosis without diverticulitis. 4. Small hiatal hernia. 5.  Aortic Atherosclerosis (ICD10-I70.0). Electronically Signed   By: Narda Rutherford M.D.   On: 05/24/2018 01:53   Dg Chest Portable 1 View  Result Date: 05/24/2018 CLINICAL DATA:  Epigastric pain EXAM: PORTABLE CHEST 1 VIEW COMPARISON:  03/13/2016 FINDINGS: The heart size and mediastinal contours are within normal limits. Both lungs are clear. The visualized skeletal structures are  unremarkable. IMPRESSION: No active disease. Electronically Signed   By: Deatra Robinson M.D.   On: 05/24/2018 00:16   Dg Abd  Portable 2 Views  Result Date: 05/24/2018 CLINICAL DATA:  Epigastric pain EXAM: PORTABLE ABDOMEN - 2 VIEW COMPARISON:  None. FINDINGS: The bowel gas pattern is normal. There is no evidence of free air. No radio-opaque calculi or other significant radiographic abnormality is seen. IMPRESSION: Negative. Electronically Signed   By: Deatra RobinsonKevin  Herman M.D.   On: 05/24/2018 00:17   [place] Abdomen Limited Ruq  Result Date: 05/24/2018 CLINICAL DATA:  Right upper quadrant pain x1 day, nausea/vomiting EXAM: ULTRASOUND ABDOMEN LIMITED RIGHT UPPER QUADRANT COMPARISON:  CT abdomen/pelvis dated 05/24/2018 FINDINGS: Gallbladder: Layering gallstones with gallbladder sludge. Very mild gallbladder wall thickening with trace pericholecystic fluid. Common bile duct: Diameter: 3 mm Liver: Coarse hepatic echotexture. No focal hepatic lesion is seen. Portal vein is patent on color Doppler imaging with normal direction of blood flow towards the liver. IMPRESSION: Cholelithiasis, with mild gallbladder wall thickening/edema, but no convincing findings to suggest acute cholecystitis. If there is continued clinical concern, consider hepatobiliary nuclear medicine scan. Electronically Signed   By: Charline BillsSriyesh  Krishnan M.D.   On: 05/24/2018 12:01    Anti-infectives: Anti-infectives (From admission, onward)   Start     Dose/Rate Route Frequency Ordered Stop   05/24/18 1115  cefTRIAXone (ROCEPHIN) 2 g in sodium chloride 0.9 % 100 mL IVPB     2 g 200 mL/hr over 30 Minutes Intravenous Every 24 hours 05/24/18 1108     05/24/18 0800  piperacillin-tazobactam (ZOSYN) IVPB 3.375 g  Status:  Discontinued     3.375 g 100 mL/hr over 30 Minutes Intravenous Every 8 hours 05/24/18 0527 05/24/18 0529   05/24/18 0630  piperacillin-tazobactam (ZOSYN) IVPB 3.375 g  Status:  Discontinued     3.375 g 12.5 mL/hr over 240 Minutes  Intravenous Every 8 hours 05/24/18 0530 05/24/18 1108   05/24/18 0245  metroNIDAZOLE (FLAGYL) IVPB 500 mg     500 mg 100 mL/hr over 60 Minutes Intravenous  Once 05/24/18 0241 05/24/18 0406   05/24/18 0215  cefTRIAXone (ROCEPHIN) 2 g in sodium chloride 0.9 % 100 mL IVPB     2 g 200 mL/hr over 30 Minutes Intravenous  Once 05/24/18 0202 05/24/18 0247      Assessment/Plan: Ms. Sharon Chung is a [age] yo with symptomatic cholelithiasis and likely cholecystitis being treated empirically with rocephin for now and having improvement in pain and WBC/ shift. Tolerating diet.  -OR tomorrow for Lap chole -No questions -NPO midnight, Fulls for now -Rocephin for acute cholecystitis  -SCDs, Lovenox ordered for prophylaxis   LOS: 0 days    Sharon Chung 05/25/2018

## 2018-05-26 ENCOUNTER — Encounter (HOSPITAL_COMMUNITY): Payer: Self-pay | Admitting: *Deleted

## 2018-05-26 ENCOUNTER — Observation Stay (HOSPITAL_COMMUNITY): Payer: Medicare Other | Admitting: Anesthesiology

## 2018-05-26 ENCOUNTER — Encounter (HOSPITAL_COMMUNITY)
Admission: EM | Disposition: A | Discharge status: Home / Self Care | Payer: Self-pay | Source: Home / Self Care | Attending: General Surgery

## 2018-05-26 DIAGNOSIS — E876 Hypokalemia: Secondary | ICD-10-CM | POA: Diagnosis present

## 2018-05-26 DIAGNOSIS — Z20828 Contact with and (suspected) exposure to other viral communicable diseases: Secondary | ICD-10-CM | POA: Diagnosis present

## 2018-05-26 DIAGNOSIS — Z8249 Family history of ischemic heart disease and other diseases of the circulatory system: Secondary | ICD-10-CM | POA: Diagnosis not present

## 2018-05-26 DIAGNOSIS — K8010 Calculus of gallbladder with chronic cholecystitis without obstruction: Secondary | ICD-10-CM | POA: Diagnosis not present

## 2018-05-26 DIAGNOSIS — K810 Acute cholecystitis: Secondary | ICD-10-CM | POA: Diagnosis not present

## 2018-05-26 DIAGNOSIS — K819 Cholecystitis, unspecified: Secondary | ICD-10-CM | POA: Diagnosis present

## 2018-05-26 DIAGNOSIS — I10 Essential (primary) hypertension: Secondary | ICD-10-CM | POA: Diagnosis present

## 2018-05-26 DIAGNOSIS — K449 Diaphragmatic hernia without obstruction or gangrene: Secondary | ICD-10-CM | POA: Diagnosis present

## 2018-05-26 DIAGNOSIS — Z9851 Tubal ligation status: Secondary | ICD-10-CM | POA: Diagnosis not present

## 2018-05-26 DIAGNOSIS — K08409 Partial loss of teeth, unspecified cause, unspecified class: Secondary | ICD-10-CM | POA: Diagnosis present

## 2018-05-26 DIAGNOSIS — Z806 Family history of leukemia: Secondary | ICD-10-CM | POA: Diagnosis not present

## 2018-05-26 DIAGNOSIS — M1990 Unspecified osteoarthritis, unspecified site: Secondary | ICD-10-CM | POA: Diagnosis present

## 2018-05-26 DIAGNOSIS — K8020 Calculus of gallbladder without cholecystitis without obstruction: Secondary | ICD-10-CM

## 2018-05-26 DIAGNOSIS — K8000 Calculus of gallbladder with acute cholecystitis without obstruction: Secondary | ICD-10-CM

## 2018-05-26 DIAGNOSIS — Z79899 Other long term (current) drug therapy: Secondary | ICD-10-CM | POA: Diagnosis not present

## 2018-05-26 DIAGNOSIS — Z96643 Presence of artificial hip joint, bilateral: Secondary | ICD-10-CM | POA: Diagnosis present

## 2018-05-26 DIAGNOSIS — Z8261 Family history of arthritis: Secondary | ICD-10-CM | POA: Diagnosis not present

## 2018-05-26 HISTORY — PX: CHOLECYSTECTOMY: SHX55

## 2018-05-26 LAB — SURGICAL PCR SCREEN
MRSA, PCR: NEGATIVE
Staphylococcus aureus: NEGATIVE

## 2018-05-26 SURGERY — LAPAROSCOPIC CHOLECYSTECTOMY
Anesthesia: General

## 2018-05-26 MED ORDER — PROPOFOL 10 MG/ML IV BOLUS
INTRAVENOUS | Status: AC
  Filled 2018-05-26: qty 20

## 2018-05-26 MED ORDER — HEMOSTATIC AGENTS (NO CHARGE) OPTIME
TOPICAL | Status: DC | PRN
  Administered 2018-05-26 (×2): 1 via TOPICAL

## 2018-05-26 MED ORDER — ONDANSETRON HCL 4 MG PO TABS: 4.0000 mg | ORAL_TABLET | Freq: Four times a day (QID) | ORAL | 0 refills | Status: DC | PRN

## 2018-05-26 MED ORDER — GLYCOPYRROLATE PF 0.2 MG/ML IJ SOSY
PREFILLED_SYRINGE | INTRAMUSCULAR | Status: DC | PRN
  Administered 2018-05-26: .2 mg via INTRAVENOUS

## 2018-05-26 MED ORDER — PROMETHAZINE HCL 25 MG/ML IJ SOLN: 6.2500 mg | INTRAMUSCULAR | Status: DC | PRN

## 2018-05-26 MED ORDER — ONDANSETRON HCL 4 MG/2ML IJ SOLN
INTRAMUSCULAR | Status: DC | PRN
  Administered 2018-05-26: 4 mg via INTRAVENOUS

## 2018-05-26 MED ORDER — LACTATED RINGERS IV SOLN: INTRAVENOUS | Status: DC

## 2018-05-26 MED ORDER — SUGAMMADEX SODIUM 200 MG/2ML IV SOLN
INTRAVENOUS | Status: DC | PRN
  Administered 2018-05-26: 163.4 mg via INTRAVENOUS

## 2018-05-26 MED ORDER — SUCCINYLCHOLINE CHLORIDE 200 MG/10ML IV SOSY
PREFILLED_SYRINGE | INTRAVENOUS | Status: AC
  Filled 2018-05-26: qty 10

## 2018-05-26 MED ORDER — MEPERIDINE HCL 50 MG/ML IJ SOLN: 6.2500 mg | INTRAMUSCULAR | Status: DC | PRN

## 2018-05-26 MED ORDER — FENTANYL CITRATE (PF) 250 MCG/5ML IJ SOLN
INTRAMUSCULAR | Status: AC
  Filled 2018-05-26: qty 5

## 2018-05-26 MED ORDER — BUPIVACAINE HCL (PF) 0.5 % IJ SOLN
INTRAMUSCULAR | Status: DC | PRN
  Administered 2018-05-26: 10 mL

## 2018-05-26 MED ORDER — LACTATED RINGERS IV SOLN
INTRAVENOUS | Status: DC
  Administered 2018-05-26: 12:00:00 via INTRAVENOUS

## 2018-05-26 MED ORDER — BUPIVACAINE HCL (PF) 0.5 % IJ SOLN
INTRAMUSCULAR | Status: AC
  Filled 2018-05-26: qty 30

## 2018-05-26 MED ORDER — SODIUM CHLORIDE 0.9 % IR SOLN
Status: DC | PRN
  Administered 2018-05-26: 1000 mL

## 2018-05-26 MED ORDER — DOCUSATE SODIUM 100 MG PO CAPS: 100.0000 mg | ORAL_CAPSULE | Freq: Two times a day (BID) | ORAL | 2 refills | Status: DC

## 2018-05-26 MED ORDER — OXYCODONE HCL 5 MG PO TABS: 5.0000 mg | ORAL_TABLET | ORAL | 0 refills | Status: DC | PRN

## 2018-05-26 MED ORDER — LIDOCAINE 2% (20 MG/ML) 5 ML SYRINGE
INTRAMUSCULAR | Status: DC | PRN
  Administered 2018-05-26: 40 mg via INTRAVENOUS

## 2018-05-26 MED ORDER — SUCCINYLCHOLINE CHLORIDE 20 MG/ML IJ SOLN
INTRAMUSCULAR | Status: DC | PRN
  Administered 2018-05-26: 140 mg via INTRAVENOUS

## 2018-05-26 MED ORDER — MUPIROCIN 2 % EX OINT
1.0000 "application " | TOPICAL_OINTMENT | Freq: Two times a day (BID) | CUTANEOUS | Status: DC
  Filled 2018-05-26: qty 22

## 2018-05-26 MED ORDER — DEXAMETHASONE SODIUM PHOSPHATE 4 MG/ML IJ SOLN
INTRAMUSCULAR | Status: DC | PRN
  Administered 2018-05-26: 8 mg via INTRAVENOUS

## 2018-05-26 MED ORDER — ONDANSETRON HCL 4 MG/2ML IJ SOLN
INTRAMUSCULAR | Status: AC
  Filled 2018-05-26: qty 2

## 2018-05-26 MED ORDER — ROCURONIUM BROMIDE 10 MG/ML (PF) SYRINGE
PREFILLED_SYRINGE | INTRAVENOUS | Status: AC
  Filled 2018-05-26: qty 20

## 2018-05-26 MED ORDER — PROPOFOL 10 MG/ML IV BOLUS
INTRAVENOUS | Status: DC | PRN
  Administered 2018-05-26: 150 mg via INTRAVENOUS

## 2018-05-26 MED ORDER — HYDROCODONE-ACETAMINOPHEN 7.5-325 MG PO TABS: 1.0000 | ORAL_TABLET | Freq: Once | ORAL | Status: DC | PRN

## 2018-05-26 MED ORDER — HYDROMORPHONE HCL 1 MG/ML IJ SOLN
0.2500 mg | INTRAMUSCULAR | Status: DC | PRN
  Administered 2018-05-26: 0.5 mg via INTRAVENOUS
  Filled 2018-05-26: qty 0.5

## 2018-05-26 MED ORDER — FENTANYL CITRATE (PF) 100 MCG/2ML IJ SOLN
INTRAMUSCULAR | Status: DC | PRN
  Administered 2018-05-26 (×5): 50 ug via INTRAVENOUS

## 2018-05-26 MED ORDER — ROCURONIUM BROMIDE 50 MG/5ML IV SOSY
PREFILLED_SYRINGE | INTRAVENOUS | Status: DC | PRN
  Administered 2018-05-26: 30 mg via INTRAVENOUS

## 2018-05-26 SURGICAL SUPPLY — 45 items
APPLICATOR ARISTA FLEXITIP XL (MISCELLANEOUS) ×1 IMPLANT
APPLIER CLIP ROT 10 11.4 M/L (STAPLE) ×2
BAG RETRIEVAL 10 (BASKET) ×1
BLADE SURG 15 STRL LF DISP TIS (BLADE) ×1 IMPLANT
BLADE SURG 15 STRL SS (BLADE) ×1
CHLORAPREP W/TINT 26 (MISCELLANEOUS) ×2 IMPLANT
CLIP APPLIE ROT 10 11.4 M/L (STAPLE) ×1 IMPLANT
CLOTH BEACON ORANGE TIMEOUT ST (SAFETY) ×2 IMPLANT
COVER LIGHT HANDLE STERIS (MISCELLANEOUS) ×4 IMPLANT
COVER WAND RF STERILE (DRAPES) ×1 IMPLANT
DECANTER SPIKE VIAL GLASS SM (MISCELLANEOUS) ×2 IMPLANT
DERMABOND ADVANCED (GAUZE/BANDAGES/DRESSINGS) ×1
DERMABOND ADVANCED .7 DNX12 (GAUZE/BANDAGES/DRESSINGS) ×1 IMPLANT
ELECT REM PT RETURN 9FT ADLT (ELECTROSURGICAL) ×2
ELECTRODE REM PT RTRN 9FT ADLT (ELECTROSURGICAL) ×1 IMPLANT
FILTER SMOKE EVAC LAPAROSHD (FILTER) ×2 IMPLANT
GLOVE BIO SURGEON STRL SZ 6.5 (GLOVE) ×2 IMPLANT
GLOVE BIOGEL PI IND STRL 6.5 (GLOVE) ×1 IMPLANT
GLOVE BIOGEL PI IND STRL 7.0 (GLOVE) ×3 IMPLANT
GLOVE BIOGEL PI INDICATOR 6.5 (GLOVE) ×1
GLOVE BIOGEL PI INDICATOR 7.0 (GLOVE) ×3
GLOVE SURG SS PI 7.5 STRL IVOR (GLOVE) ×1 IMPLANT
GOWN STRL REUS W/TWL LRG LVL3 (GOWN DISPOSABLE) ×6 IMPLANT
HEMOSTAT ARISTA ABSORB 3G PWDR (HEMOSTASIS) ×1 IMPLANT
HEMOSTAT SNOW SURGICEL 2X4 (HEMOSTASIS) ×2 IMPLANT
INST SET LAPROSCOPIC AP (KITS) ×2 IMPLANT
KIT TURNOVER KIT A (KITS) ×2 IMPLANT
MANIFOLD NEPTUNE II (INSTRUMENTS) ×2 IMPLANT
NDL INSUFFLATION 14GA 120MM (NEEDLE) ×1 IMPLANT
NEEDLE INSUFFLATION 14GA 120MM (NEEDLE) ×2 IMPLANT
NS IRRIG 1000ML POUR BTL (IV SOLUTION) ×2 IMPLANT
PACK LAP CHOLE LZT030E (CUSTOM PROCEDURE TRAY) ×2 IMPLANT
PAD ARMBOARD 7.5X6 YLW CONV (MISCELLANEOUS) ×2 IMPLANT
SET BASIN LINEN APH (SET/KITS/TRAYS/PACK) ×2 IMPLANT
SLEEVE ENDOPATH XCEL 5M (ENDOMECHANICALS) ×2 IMPLANT
SUT MNCRL AB 4-0 PS2 18 (SUTURE) ×3 IMPLANT
SUT VICRYL 0 UR6 27IN ABS (SUTURE) ×2 IMPLANT
SYS BAG RETRIEVAL 10MM (BASKET) ×1
SYSTEM BAG RETRIEVAL 10MM (BASKET) ×1 IMPLANT
TROCAR ENDO BLADELESS 11MM (ENDOMECHANICALS) ×2 IMPLANT
TROCAR XCEL NON-BLD 5MMX100MML (ENDOMECHANICALS) ×2 IMPLANT
TROCAR XCEL UNIV SLVE 11M 100M (ENDOMECHANICALS) ×2 IMPLANT
TUBE CONNECTING 12X1/4 (SUCTIONS) ×2 IMPLANT
TUBING INSUFFLATION (TUBING) ×2 IMPLANT
WARMER LAPAROSCOPE (MISCELLANEOUS) ×2 IMPLANT

## 2018-05-26 NOTE — Anesthesia Preprocedure Evaluation (Signed)
Anesthesia Evaluation    Airway Mallampati: II       Dental  (+) Edentulous Upper, Partial Lower   Pulmonary    breath sounds clear to auscultation       Cardiovascular hypertension,  Rhythm:regular     Neuro/Psych    GI/Hepatic hiatal hernia,   Endo/Other    Renal/GU      Musculoskeletal   Abdominal   Peds  Hematology  (+) Blood dyscrasia, anemia ,   Anesthesia Other Findings Obesity HTN without c/o other cv hx Hypokalemia cholecystitis  Reproductive/Obstetrics                             Anesthesia Physical Anesthesia Plan  ASA: III  Anesthesia Plan: General   Post-op Pain Management:    Induction:   PONV Risk Score and Plan:   Airway Management Planned:   Additional Equipment:   Intra-op Plan:   Post-operative Plan:   Informed Consent: I have reviewed the patients History and Physical, chart, labs and discussed the procedure including the risks, benefits and alternatives for the proposed anesthesia with the patient or authorized representative who has indicated his/her understanding and acceptance.     Dental Advisory Given  Plan Discussed with: Anesthesiologist  Anesthesia Plan Comments:         Anesthesia Quick Evaluation

## 2018-05-26 NOTE — Anesthesia Procedure Notes (Signed)
Procedure Name: Intubation Date/Time: 05/26/2018 12:09 PM Performed by: Andree Elk, Amy A, CRNA Pre-anesthesia Checklist: Patient identified, Patient being monitored, Timeout performed, Emergency Drugs available and Suction available Patient Re-evaluated:Patient Re-evaluated prior to induction Oxygen Delivery Method: Circle system utilized Preoxygenation: Pre-oxygenation with 100% oxygen Induction Type: IV induction and Rapid sequence Laryngoscope Size: Mac and 3 Grade View: Grade I Tube type: Oral Tube size: 7.0 mm Number of attempts: 1 Airway Equipment and Method: Stylet Placement Confirmation: ETT inserted through vocal cords under direct vision,  positive ETCO2 and breath sounds checked- equal and bilateral Secured at: 21 cm Tube secured with: Tape Dental Injury: Teeth and Oropharynx as per pre-operative assessment

## 2018-05-26 NOTE — Interval H&P Note (Signed)
History and Physical Interval Note:  05/26/2018 11:06 AM  Sharon Chung  has presented today for surgery, with the diagnosis of Cholelithiasis.  The various methods of treatment have been discussed with the patient and family. After consideration of risks, benefits and other options for treatment, the patient has consented to  Procedure(s): LAPAROSCOPIC CHOLECYSTECTOMY (N/A) as a surgical intervention.  The patient's history has been reviewed, patient examined, no change in status, stable for surgery.  I have reviewed the patient's chart and labs.  Questions were answered to the patient's satisfaction.    No changes or questions.   Lucretia Roers

## 2018-05-26 NOTE — Transfer of Care (Signed)
Immediate Anesthesia Transfer of Care Note  Patient: Sharon Chung  Procedure(s) Performed: LAPAROSCOPIC CHOLECYSTECTOMY (N/A )  Patient Location: PACU  Anesthesia Type:General  Level of Consciousness: awake, oriented and patient cooperative  Airway & Oxygen Therapy: Patient Spontanous Breathing and Patient connected to face mask oxygen  Post-op Assessment: Report given to RN and Post -op Vital signs reviewed and stable  Post vital signs: Reviewed and stable  Last Vitals:  Vitals Value Taken Time  BP 142/70 05/26/2018  1:06 PM  Temp 37 C 05/26/2018  1:06 PM  Pulse 56 05/26/2018  1:11 PM  Resp 19 05/26/2018  1:11 PM  SpO2 98 % 05/26/2018  1:11 PM  Vitals shown include unvalidated device data.  Last Pain:  Vitals:   05/26/18 1306  TempSrc:   PainSc: 0-No pain      Patients Stated Pain Goal: 7 (05/26/18 1043)  Complications: No apparent anesthesia complications

## 2018-05-26 NOTE — Progress Notes (Signed)
Patient discharged home with instructions given on medications and follow up visits,patient  verbalized understanding. Prescriptions sent to Pharmacy of choice documented on AVS. Accompanied by staff to an awaiting vehicle. 

## 2018-05-26 NOTE — Op Note (Signed)
Operative Note   Preoperative Diagnosis: Symptomatic cholelithiasis, possible cholecystitis    Postoperative Diagnosis: Acute cholecystitis    Procedure(s) Performed: Laparoscopic cholecystectomy   Surgeon: Lillia Abed C. Henreitta Leber, MD   Assistants: Franky Macho, MD    Anesthesia: General endotracheal   Anesthesiologist: Arbie Cookey, MD    Specimens: Gallbladder    Estimated Blood Loss: Minimal    Blood Replacement: None    Complications: None    Operative Findings: Distended edematous gallbladder consistent with cholecystitis    Procedure: The patient was taken to the operating room and placed supine. General endotracheal anesthesia was induced. Intravenous antibiotics were administered per protocol. An orogastric tube positioned to decompress the stomach. The abdomen was prepared and draped in the usual sterile fashion.    A supraumbilical incision was made and a Veress technique was utilized to achieve pneumoperitoneum to 15 mmHg with carbon dioxide. A 11 mm optiview port was placed through the supraumbilical region, and a 10 mm 0-degree operative laparoscope was introduced. The area underlying the trocar and Veress needle were inspected and without evidence of injury.  Remaining trocars were placed under direct vision. Two 5 mm ports were placed in the right abdomen, between the anterior axillary and midclavicular line.  A final 11 mm port was placed through the mid-epigastrium, near the falciform ligament.    The gallbladder fundus was elevated cephalad and the infundibulum was retracted to the patient's right. The gallbladder/cystic duct junction was skeletonized. The cystic artery noted in the triangle of Calot and was also skeletonized.  We then continued liberal medial and lateral dissection until the critical view of safety was achieved.    The cystic duct was triply clipped and divided.  The cystic artery was doubly clipped and and a posterior branch in the mesentery  was clipped and divided. The gallbladder was then dissected from the liver bed with electrocautery. The specimen was placed in an Endopouch. Final inspection revealed acceptable hemostasis. Arista and Surgical Jamelle Haring was placed in the gallbladder bed.   All pneumopertioneum was evacuated through the PlumeAway due to COVID 19 and then the endopouch was retrieved through the epigastric site.  Trocars were removed. The epigastric and umbilical sites were smaller than my finger and were not closed.  Skin incisions were closed with 4-0 Monocryl subcuticular sutures and Dermabond. The patient was awakened from anesthesia and extubated without complication.    Algis Greenhouse, MD [facility] [address] [place], [place] [address] [phone] (office)

## 2018-05-26 NOTE — Discharge Instructions (Signed)
Discharge Laparoscopic Surgery Instructions: ° °Common Complaints: °Right shoulder pain is common after laparoscopic surgery. This is secondary to the gas used in the surgery being trapped under the diaphragm.  °Walk to help your body absorb the gas. This will improve in a few days. °Pain at the port sites are common, especially the larger port sites. This will improve with time.  °Some nausea is common and poor appetite. The main goal is to stay hydrated the first few days after surgery.  ° °Diet/ Activity: °Diet as tolerated. You may not have an appetite, but it is important to stay hydrated. Drink 64 ounces of water a day. Your appetite will return with time.  °Shower per your regular routine daily.  Do not take hot showers. Take warm showers that are less than 10 minutes. °Rest and listen to your body, but do not remain in bed all day.  °Walk everyday for at least 15-20 minutes. Deep cough and move around every 1-2 hours in the first few days after surgery.  °Do not lift > 10 lbs, perform excessive bending, pushing, pulling, squatting for 1-2 weeks after surgery.  °Do not pick at the dermabond glue on your incision sites.  This glue film will remain in place for 1-2 weeks and will start to peel off.  °Do not place lotions or balms on your incision unless instructed to specifically by Dr. Raylee Strehl.  ° °Medication: °Take tylenol and ibuprofen as needed for pain control, alternating every 4-6 hours.  °Example:  °Tylenol 1000mg @ 6am, 12noon, 6pm, 12midnight (Do not exceed 4000mg of tylenol a day). Ibuprofen 800mg @ 9am, 3pm, 9pm, 3am (Do not exceed 3600mg of ibuprofen a day).  °Take Roxicodone for breakthrough pain every 4 hours.  °Take Colace for constipation related to narcotic pain medication. If you do not have a bowel movement in 2 days, take Miralax over the counter.  °Drink plenty of water to also prevent constipation.  ° °Contact Information: °If you have questions or concerns, please call our office,  [phone], Monday- Thursday 8AM-5PM and Friday 8AM-12Noon.  °If it is after hours or on the weekend, please call [facility] Main Number, [phone], and ask to speak to the surgeon on call for Dr. Maida Widger at [place].  ° °Laparoscopic Cholecystectomy, Care After °This sheet gives you information about how to care for yourself after your procedure. Your doctor may also give you more specific instructions. If you have problems or questions, contact your doctor. °Follow these instructions at home: °Care for cuts from surgery (incisions) ° °· Follow instructions from your doctor about how to take care of your cuts from surgery. Make sure you: °? Wash your hands with soap and water before you change your bandage (dressing). If you cannot use soap and water, use hand sanitizer. °? Change your bandage as told by your doctor. °? Leave stitches (sutures), skin glue, or skin tape (adhesive) strips in place. They may need to stay in place for 2 weeks or longer. If tape strips get loose and curl up, you may trim the loose edges. Do not remove tape strips completely unless your doctor says it is okay. °· Do not take baths, swim, or use a hot tub until your doctor says it is okay.  °· You may shower. °· Check your surgical cut area every day for signs of infection. Check for: °? More redness, swelling, or pain. °? More fluid or blood. °? Warmth. °? Pus or a bad smell. °Activity °· Do not   drive or use heavy machinery while taking prescription pain medicine. °· Do not lift anything that is heavier than 10 lb (4.5 kg) until your doctor says it is okay. °· Do not play contact sports until your doctor says it is okay. °· Do not drive for 24 hours if you were given a medicine to help you relax (sedative). °· Rest as needed. Do not return to work or school until your doctor says it is okay. °General instructions °· Take over-the-counter and prescription medicines only as told by your doctor. °· To prevent or treat constipation while  you are taking prescription pain medicine, your doctor may recommend that you: °? Drink enough fluid to keep your pee (urine) clear or pale yellow. °? Take over-the-counter or prescription medicines. °? Eat foods that are high in fiber, such as fresh fruits and vegetables, whole grains, and beans. °? Limit foods that are high in fat and processed sugars, such as fried and sweet foods. °Contact a doctor if: °· You develop a rash. °· You have more redness, swelling, or pain around your surgical cuts. °· You have more fluid or blood coming from your surgical cuts. °· Your surgical cuts feel warm to the touch. °· You have pus or a bad smell coming from your surgical cuts. °· You have a fever. °· One or more of your surgical cuts breaks open. °Get help right away if: °· You have trouble breathing. °· You have chest pain. °· You have pain that is getting worse in your shoulders. °· You faint or feel dizzy when you stand. °· You have very bad pain in your belly (abdomen). °· You are sick to your stomach (nauseous) for more than one day. °· You have throwing up (vomiting) that lasts for more than one day. °· You have leg pain. °This information is not intended to replace advice given to you by your health care provider. Make sure you discuss any questions you have with your health care provider. °Document Released: 10/04/2007 Document Revised: 07/16/2015 Document Reviewed: 06/13/2015 °Elsevier Interactive Patient Education © 2019 Elsevier Inc. ° ° °

## 2018-05-26 NOTE — Progress Notes (Signed)
[facility]  Lap cholecystectomy completed. PRN pain meds and diet ordered. Patient's son, Jorja Loa was called and no answer. A voicemail was left confirming surgery went well. Attempted to call the home number to speak with Lynden Ang, mother of the patient, informed her that the surgery went well.  She asked that I call Tim back because he was on the phone with her originally.   If patient is doing well and eat and drinks and has good pain control, then she could go home. RN upstairs can let me know if she wants to go or no tonight.   Algis Greenhouse, MD [facility] [address] [place], [place] [address] [phone] (office)

## 2018-05-26 NOTE — Discharge Summary (Signed)
Physician Discharge Summary  Patient ID: Sharon SalinaHilda Chung MRN: [id number] DOB/AGE: 65/09/1955 65 y.o.  Admit date: 05/23/2018 Discharge date: 05/26/2018  Admission Diagnoses: Cholecystitis, Gallstones   Discharge Diagnoses:  Principal Problem:   Calculus of gallbladder with acute cholecystitis without obstruction Active Problems:   RUQ pain   Gallstones   Hypokalemia   Hypertension   Cholecystitis   Discharged Condition: good  Hospital Course: Sharon Chung is a 63 yo with RUQ pain that was seen in the Ed and had findings concerning for cholecystitis and was started on antibiotics. She had some improvement in her pain, and we took her to surgery for a laparoscopic cholecystectomy 05/26/2018.  She did well post operatively. She tolerated her diet and had adequate pain control. She was ambulating and ready to go home.   Consults:  Hospitalist admission- transferred to surgery 2 Significant Diagnostic Studies: CT a/p- pericholecystic fluid and distended gallbladder with stones, US - stones and possible mild thickening   Treatments: IV hydration and Rocephin IV and Laparoscopic cholecystectomy 05/26/2018  Discharge Exam: Blood pressure (!) 85/74, pulse (!) 49, temperature 98.2 F (36.8 C), temperature source Oral, resp. rate 16, height 5\' 2"  (1.575 m), weight 81.7 kg, SpO2 96 %.   Disposition: Discharge disposition: 01-Home or Self Care       Discharge Instructions    Call MD for:  difficulty breathing, headache or visual disturbances   Complete by:  As directed    Call MD for:  extreme fatigue   Complete by:  As directed    Call MD for:  persistant dizziness or light-headedness   Complete by:  As directed    Call MD for:  persistant nausea and vomiting   Complete by:  As directed    Call MD for:  redness, tenderness, or signs of infection (pain, swelling, redness, odor or green/yellow discharge around incision site)   Complete by:  As directed    Call MD for:  severe uncontrolled  pain   Complete by:  As directed    Call MD for:  temperature >100.4   Complete by:  As directed    Diet - low sodium heart healthy   Complete by:  As directed    Increase activity slowly   Complete by:  As directed      Allergies as of 05/26/2018   No Known Allergies     Medication List    TAKE these medications   ALPRAZolam 0.5 MG tablet Commonly known as:  XANAX Take 0.25 mg by mouth at bedtime as needed.   docusate sodium 100 MG capsule Commonly known as:  Colace Take 1 capsule (100 mg total) by mouth 2 (two) times daily.   losartan-hydrochlorothiazide 100-25 MG tablet Commonly known as:  HYZAAR Take 1 tablet by mouth daily.   ondansetron 4 MG tablet Commonly known as:  ZOFRAN Take 1 tablet (4 mg total) by mouth every 6 (six) hours as needed for nausea.   oxyCODONE 5 MG immediate release tablet Commonly known as:  Oxy IR/ROXICODONE Take 1 tablet (5 mg total) by mouth every 4 (four) hours as needed for moderate pain.   solifenacin 10 MG tablet Commonly known as:  VESIcare Take 1 tablet (10 mg total) by mouth daily. What changed:  when to take this   Vitamin D3 75 MCG (3000 UT) Tabs Take 1,000 Units by mouth. Take 1 tab daily      Follow-up Information    Lucretia RoersBridges, Nasser Ku C, MD On 06/10/2018.   Specialty:  General Surgery Why:  Dr. Henreitta Leber will do a post operative phone call visit on 06/10/2018. If you have issues or concerns you can call and make an appt in person.  Contact information: [address] [place] [facility] [address] [phone]        Toma Deiters, MD Follow up in 8 day(s).   Specialty:  Internal Medicine Why:  In office visit with Dr. Olena Leatherwood at 3:00 p.m. Contact information: [address] [place] [address] [phone]           Signed: Lucretia Roers 05/26/2018, 6:12 PM

## 2018-05-26 NOTE — Anesthesia Postprocedure Evaluation (Signed)
Anesthesia Post Note  Patient: Sharon Chung  Procedure(s) Performed: LAPAROSCOPIC CHOLECYSTECTOMY (N/A )  Patient location during evaluation: PACU Anesthesia Type: General Level of consciousness: oriented and awake Pain management: pain level controlled Vital Signs Assessment: post-procedure vital signs reviewed and stable Respiratory status: spontaneous breathing Cardiovascular status: stable Postop Assessment: no apparent nausea or vomiting Anesthetic complications: no     Last Vitals:  Vitals:   05/26/18 1306 05/26/18 1315  BP: (!) 142/70 (!) 144/74  Pulse:  (!) 51  Resp: 16 14  Temp: 37 C   SpO2: 99% 99%    Last Pain:  Vitals:   05/26/18 1306  TempSrc:   PainSc: 0-No pain                 ADAMS, AMY A

## 2018-05-27 ENCOUNTER — Encounter (HOSPITAL_COMMUNITY): Payer: Self-pay | Admitting: General Surgery

## 2018-06-03 ENCOUNTER — Other Ambulatory Visit: Payer: Self-pay

## 2018-06-03 DIAGNOSIS — I10 Essential (primary) hypertension: Secondary | ICD-10-CM | POA: Diagnosis not present

## 2018-06-03 DIAGNOSIS — Z0000 Encounter for general adult medical examination without abnormal findings: Secondary | ICD-10-CM | POA: Diagnosis not present

## 2018-06-03 DIAGNOSIS — M545 Low back pain: Secondary | ICD-10-CM | POA: Diagnosis not present

## 2018-06-03 NOTE — Patient Outreach (Signed)
[facility]) Care Management  06/03/2018  Everline Flow [birth year] [id number]  EMMI: general discharge red alert Referral date: 06/03/18 Referral reason: Lost interest in things: yes,  Sad/ hopeless/ anxious/ empty: yes Insurance: [employer] Day # 4  Telephone call to patient regarding EMMI general discharge red alert. HIPAA verified with patient. RNCM introduced herself and explained reason for call.  Patient states she has not lost interest in things or is feeling sad/ hopeless/ anxious/ empty.  Patient states she is doing well overall. She states she has a follow up with her primary MD today. Patient states she is taking her medications as prescribed. Patient states she will ask her doctor for yeast infection medication due to her taking an antibiotic. RNCM discussed signs/ symptoms of infection with patient. Advised patient to call her doctor if symptoms are noted. Patient states she takes Ibuprofen for pain as advised. Patient reports her pain level today as a 1.  Patient reports her follow up visit with the surgeon in on 06/10/18.  Patient denies any further needs or concerns.  RNCM advised patient to notify MD of any changes in condition prior to scheduled appointment. RNCM provided contact name and number:24 hour nurse advise line [phone].  RNCM verified patient aware of 911 services for urgent/ emergent needs. RNCM discussed COVID 19 precautions and symptoms.  Advised patient to contact her doctor for minor symptoms. If symptoms more severe call 911.  Patient verbalized understanding.   PLAN: RNCM will close due to patient being assessed and having no further needs.   George Ina RN,BSN,CCM [facility] Telephonic  [phone]

## 2018-06-10 ENCOUNTER — Ambulatory Visit (INDEPENDENT_AMBULATORY_CARE_PROVIDER_SITE_OTHER): Payer: Self-pay | Admitting: General Surgery

## 2018-06-10 ENCOUNTER — Other Ambulatory Visit: Payer: Self-pay

## 2018-06-10 DIAGNOSIS — K8020 Calculus of gallbladder without cholecystitis without obstruction: Secondary | ICD-10-CM

## 2018-06-10 NOTE — Progress Notes (Signed)
[facility]  I am calling the patient for post operative evaluation due to the current COVID 19 pandemic.  The patient had a laparoscopic cholecystectomy on 05/26/2018. The patient reports that they are doing well and having no issues. The are tolerating a diet, having good pain control, and having regular Bms.  The patient has no concerns.   Will see the patient PRN.  She did go to her PCP and says she had high blood pressure when she saw him.   Pathology: Diagnosis Gallbladder - CHRONIC CHOLECYSTITIS WITH FOCAL INTESTINAL METAPLASIA. - CHOLELITHIASIS. - NO DYSPLASIA OR MALIGNANCY.  Algis Greenhouse, MD [facility] [address] [place], [place] [address] [phone] (office)

## 2018-06-30 ENCOUNTER — Telehealth: Payer: Self-pay | Admitting: Obstetrics & Gynecology

## 2018-06-30 NOTE — Telephone Encounter (Signed)
Pt is wanting to see if she needs to have her pessary cleaned every 3 months and if so she would like to schedule an appt.

## 2018-06-30 NOTE — Telephone Encounter (Signed)
Called pt. Her mom stated she will have her to call us.

## 2018-07-07 ENCOUNTER — Encounter: Payer: Self-pay | Admitting: Obstetrics & Gynecology

## 2018-07-07 ENCOUNTER — Other Ambulatory Visit: Payer: Self-pay

## 2018-07-07 ENCOUNTER — Ambulatory Visit (INDEPENDENT_AMBULATORY_CARE_PROVIDER_SITE_OTHER): Payer: Medicare Other | Admitting: Obstetrics & Gynecology

## 2018-07-07 VITALS — BP 136/87 | HR 74 | Ht 61.0 in | Wt 197.0 lb

## 2018-07-07 DIAGNOSIS — N814 Uterovaginal prolapse, unspecified: Secondary | ICD-10-CM

## 2018-07-07 DIAGNOSIS — Z4689 Encounter for fitting and adjustment of other specified devices: Secondary | ICD-10-CM

## 2018-07-07 NOTE — Progress Notes (Signed)
Chief Complaint  Patient presents with  . Pessary Check    Blood pressure 136/87, pulse 74, height 5\' 1"  (1.549 m), weight 197 lb (89.4 kg).  Sharon Chung presents today for routine follow up related to her pessary.   She uses a milex ring with support #3 She reports no vaginal discharge or vaginal bleeding.  Exam reveals no undue vaginal mucosal pressure of breakdown, no discharge and no vaginal bleeding.  The pessary is removed, cleaned and replaced without difficulty.    Sharon Chung will be sen back in 6 months for continued follow up.  Florian Buff, MD  07/07/2018 9:11 AM

## 2018-09-03 DIAGNOSIS — I10 Essential (primary) hypertension: Secondary | ICD-10-CM | POA: Diagnosis not present

## 2018-09-03 DIAGNOSIS — Z0000 Encounter for general adult medical examination without abnormal findings: Secondary | ICD-10-CM | POA: Diagnosis not present

## 2018-09-03 DIAGNOSIS — M545 Low back pain: Secondary | ICD-10-CM | POA: Diagnosis not present

## 2019-03-18 DIAGNOSIS — Z0000 Encounter for general adult medical examination without abnormal findings: Secondary | ICD-10-CM | POA: Diagnosis not present

## 2019-03-18 DIAGNOSIS — I10 Essential (primary) hypertension: Secondary | ICD-10-CM | POA: Diagnosis not present

## 2019-03-18 DIAGNOSIS — M545 Low back pain: Secondary | ICD-10-CM | POA: Diagnosis not present

## 2019-04-19 DIAGNOSIS — M79671 Pain in right foot: Secondary | ICD-10-CM | POA: Diagnosis not present

## 2019-04-19 DIAGNOSIS — M19071 Primary osteoarthritis, right ankle and foot: Secondary | ICD-10-CM | POA: Diagnosis not present

## 2019-04-28 DIAGNOSIS — M65841 Other synovitis and tenosynovitis, right hand: Secondary | ICD-10-CM | POA: Diagnosis not present

## 2019-04-28 DIAGNOSIS — M65351 Trigger finger, right little finger: Secondary | ICD-10-CM | POA: Diagnosis not present

## 2019-04-28 DIAGNOSIS — M65331 Trigger finger, right middle finger: Secondary | ICD-10-CM | POA: Diagnosis not present

## 2019-04-28 DIAGNOSIS — M65341 Trigger finger, right ring finger: Secondary | ICD-10-CM | POA: Diagnosis not present

## 2019-04-28 DIAGNOSIS — M79641 Pain in right hand: Secondary | ICD-10-CM | POA: Diagnosis not present

## 2019-06-15 ENCOUNTER — Other Ambulatory Visit (HOSPITAL_COMMUNITY)
Admission: RE | Admit: 2019-06-15 | Discharge: 2019-06-15 | Disposition: A | Discharge status: Home / Self Care | Payer: Medicare Other | Source: Ambulatory Visit | Attending: Obstetrics & Gynecology | Admitting: Obstetrics & Gynecology

## 2019-06-15 ENCOUNTER — Encounter: Payer: Self-pay | Admitting: Obstetrics & Gynecology

## 2019-06-15 ENCOUNTER — Ambulatory Visit (INDEPENDENT_AMBULATORY_CARE_PROVIDER_SITE_OTHER): Payer: Medicare Other | Admitting: Obstetrics & Gynecology

## 2019-06-15 VITALS — Ht 62.0 in | Wt 217.0 lb

## 2019-06-15 DIAGNOSIS — Z124 Encounter for screening for malignant neoplasm of cervix: Secondary | ICD-10-CM | POA: Diagnosis present

## 2019-06-15 DIAGNOSIS — Z1151 Encounter for screening for human papillomavirus (HPV): Secondary | ICD-10-CM | POA: Insufficient documentation

## 2019-06-15 DIAGNOSIS — Z01419 Encounter for gynecological examination (general) (routine) without abnormal findings: Secondary | ICD-10-CM | POA: Diagnosis not present

## 2019-06-15 DIAGNOSIS — Z9851 Tubal ligation status: Secondary | ICD-10-CM | POA: Diagnosis not present

## 2019-06-15 DIAGNOSIS — Z780 Asymptomatic menopausal state: Secondary | ICD-10-CM | POA: Diagnosis not present

## 2019-06-15 NOTE — Progress Notes (Signed)
Patient ID: Sharon Chung, female   DOB: 07-Dec-1955, 63 y.o.   MRN: [id number] Subjective:     Sharon Chung is a 64 y.o. female here for a routine exam.  No LMP recorded. Patient is postmenopausal. G1P1001 Birth Control Method:  Post menopausal Menstrual Calendar(currently): amenorrheic  Current complaints: left hip.   Current acute medical issues:  none   Recent Gynecologic History No LMP recorded. Patient is postmenopausal. Last Pap: 3 years,  normal Last mammogram: 3 years,  ordered  Past Medical History:  Diagnosis Date  . Arthritis   . H/O hiatal hernia   . Hypertension     Past Surgical History:  Procedure Laterality Date  . CHOLECYSTECTOMY N/A 05/26/2018   Procedure: LAPAROSCOPIC CHOLECYSTECTOMY;  Surgeon: Lucretia Roers, MD;  Location: [facility];  Service: General;  Laterality: N/A;  . JOINT REPLACEMENT Right 03/2017   Right THA.  Marland Kitchen TOTAL HIP ARTHROPLASTY Left 08/18/2013   Procedure: LEFT TOTAL HIP ARTHROPLASTY ANTERIOR APPROACH;  Surgeon: Shelda Pal, MD;  Location: [facility];  Service: Orthopedics;  Laterality: Left;  . TUBAL LIGATION      OB History    Gravida  1   Para  1   Term  1   Preterm      AB      Living  1     SAB      TAB      Ectopic      Multiple      Live Births  1           Social History   Socioeconomic History  . Marital status: Divorced    Spouse name: Not on file  . Number of children: Not on file  . Years of education: Not on file  . Highest education level: Not on file  Occupational History  . Not on file  Tobacco Use  . Smoking status: Never Smoker  . Smokeless tobacco: Never Used  Substance and Sexual Activity  . Alcohol use: No  . Drug use: No  . Sexual activity: Not Currently    Birth control/protection: Post-menopausal, Surgical    Comment: tubal  Other Topics Concern  . Not on file  Social History Narrative  . Not on file   Social Determinants of Health   Financial Resource Strain: Low Risk   .  Difficulty of Paying Living Expenses: Not hard at all  Food Insecurity: No Food Insecurity  . Worried About Programme researcher, broadcasting/film/video in the Last Year: Never true  . Ran Out of Food in the Last Year: Never true  Transportation Needs: No Transportation Needs  . Lack of Transportation (Medical): No  . Lack of Transportation (Non-Medical): No  Physical Activity: Inactive  . Days of Exercise per Week: 0 days  . Minutes of Exercise per Session: 0 min  Stress: No Stress Concern Present  . Feeling of Stress : Not at all  Social Connections: Slightly Isolated  . Frequency of Communication with Friends and Family: More than three times a week  . Frequency of Social Gatherings with Friends and Family: More than three times a week  . Attends Religious Services: More than 4 times per year  . Active Member of Clubs or Organizations: Yes  . Attends Banker Meetings: More than 4 times per year  . Marital Status: Divorced    Family History  Problem Relation Age of Onset  . Heart attack Father   . Heart attack Mother   .  Arthritis Mother   . Heart attack Paternal Grandfather   . Leukemia Paternal Grandmother   . Heart attack Maternal Grandmother   . Heart attack Maternal Grandfather      Current Outpatient Medications:  .  ALPRAZolam (XANAX) 0.5 MG tablet, Take 0.25 mg by mouth at bedtime as needed. , Disp: , Rfl:  .  Cholecalciferol (VITAMIN D3) 3000 UNITS TABS, Take 1,000 Units by mouth. Take 1 tab daily, Disp: , Rfl:  .  losartan-hydrochlorothiazide (HYZAAR) 100-25 MG tablet, Take 1 tablet by mouth daily., Disp: , Rfl:  .  rosuvastatin (CRESTOR) 10 MG tablet, rosuvastatin 10 mg tablet, Disp: , Rfl:  .  solifenacin (VESICARE) 10 MG tablet, Take 1 tablet (10 mg total) by mouth daily., Disp: 30 tablet, Rfl: 11  Review of Systems  Review of Systems  Constitutional: Negative for fever, chills, weight loss, malaise/fatigue and diaphoresis.  HENT: Negative for hearing loss, ear pain,  nosebleeds, congestion, sore throat, neck pain, tinnitus and ear discharge.   Eyes: Negative for blurred vision, double vision, photophobia, pain, discharge and redness.  Respiratory: Negative for cough, hemoptysis, sputum production, shortness of breath, wheezing and stridor.   Cardiovascular: Negative for chest pain, palpitations, orthopnea, claudication, leg swelling and PND.  Gastrointestinal: negative for abdominal pain. Negative for heartburn, nausea, vomiting, diarrhea, constipation, blood in stool and melena.  Genitourinary: Negative for dysuria, urgency, frequency, hematuria and flank pain.  Musculoskeletal: Negative for myalgias, back pain, joint pain and falls.  Skin: Negative for itching and rash.  Neurological: Negative for dizziness, tingling, tremors, sensory change, speech change, focal weakness, seizures, loss of consciousness, weakness and headaches.  Endo/Heme/Allergies: Negative for environmental allergies and polydipsia. Does not bruise/bleed easily.  Psychiatric/Behavioral: Negative for depression, suicidal ideas, hallucinations, memory loss and substance abuse. The patient is not nervous/anxious and does not have insomnia.        Objective:  Height 5\' 2"  (1.575 m), weight 217 lb (98.4 kg).   Physical Exam  Vitals reviewed. Constitutional: She is oriented to person, place, and time. She appears well-developed and well-nourished.  HENT:  Head: Normocephalic and atraumatic.        Right Ear: External ear normal.  Left Ear: External ear normal.  Nose: Nose normal.  Mouth/Throat: Oropharynx is clear and moist.  Eyes: Conjunctivae and EOM are normal. Pupils are equal, round, and reactive to light. Right eye exhibits no discharge. Left eye exhibits no discharge. No scleral icterus.  Neck: Normal range of motion. Neck supple. No tracheal deviation present. No thyromegaly present.  Cardiovascular: Normal rate, regular rhythm, normal heart sounds and intact distal pulses.   Exam reveals no gallop and no friction rub.   No murmur heard. Respiratory: Effort normal and breath sounds normal. No respiratory distress. She has no wheezes. She has no rales. She exhibits no tenderness.  GI: Soft. Bowel sounds are normal. She exhibits no distension and no mass. There is no tenderness. There is no rebound and no guarding.  Genitourinary:  Breasts no masses skin changes or nipple changes bilaterally      Vulva is normal without lesions Vagina is pink moist without discharge Cervix normal in appearance and pap is done Uterus is normal size shape and contour Adnexa is negative with normal sized ovaries  {Rectal    hemoccult negative, normal tone, no masses   Pessary removed replaced mucosa is normal  Musculoskeletal: Normal range of motion. She exhibits no edema and no tenderness.  Neurological: She is alert and oriented to person,  place, and time. She has normal reflexes. She displays normal reflexes. No cranial nerve deficit. She exhibits normal muscle tone. Coordination normal.  Skin: Skin is warm and dry. No rash noted. No erythema. No pallor.  Psychiatric: She has a normal mood and affect. Her behavior is normal. Judgment and thought content normal.       Medications Ordered at today's visit: No orders of the defined types were placed in this encounter.   Other orders placed at today's visit: No orders of the defined types were placed in this encounter.     Assessment:    Normal Gyn exam.    Plan:    Mammogram ordered. Follow up in: 3 years.     Return if symptoms worsen or fail to improve, for 3 years for yearly + Pap, when the patient decides for pessary eval.

## 2019-06-19 LAB — CYTOLOGY - PAP
Comment: NEGATIVE
Comment: NEGATIVE
Diagnosis: REACTIVE
HPV 16: NEGATIVE
HPV 18 / 45: NEGATIVE
High risk HPV: POSITIVE — AB

## 2019-06-25 DIAGNOSIS — Z0000 Encounter for general adult medical examination without abnormal findings: Secondary | ICD-10-CM | POA: Diagnosis not present

## 2019-06-25 DIAGNOSIS — I10 Essential (primary) hypertension: Secondary | ICD-10-CM | POA: Diagnosis not present

## 2019-06-25 DIAGNOSIS — M545 Low back pain: Secondary | ICD-10-CM | POA: Diagnosis not present

## 2019-08-05 DIAGNOSIS — I10 Essential (primary) hypertension: Secondary | ICD-10-CM | POA: Diagnosis not present

## 2019-08-05 DIAGNOSIS — M545 Low back pain: Secondary | ICD-10-CM | POA: Diagnosis not present

## 2019-11-05 DIAGNOSIS — I10 Essential (primary) hypertension: Secondary | ICD-10-CM | POA: Diagnosis not present

## 2019-11-05 DIAGNOSIS — E7849 Other hyperlipidemia: Secondary | ICD-10-CM | POA: Diagnosis not present

## 2019-11-05 DIAGNOSIS — M545 Low back pain, unspecified: Secondary | ICD-10-CM | POA: Diagnosis not present

## 2020-01-09 DIAGNOSIS — Z23 Encounter for immunization: Secondary | ICD-10-CM | POA: Diagnosis not present

## 2020-01-09 DIAGNOSIS — S61012A Laceration without foreign body of left thumb without damage to nail, initial encounter: Secondary | ICD-10-CM | POA: Diagnosis not present

## 2020-01-19 DIAGNOSIS — Z4802 Encounter for removal of sutures: Secondary | ICD-10-CM | POA: Diagnosis not present

## 2020-02-08 DIAGNOSIS — M545 Low back pain, unspecified: Secondary | ICD-10-CM | POA: Diagnosis not present

## 2020-02-08 DIAGNOSIS — I10 Essential (primary) hypertension: Secondary | ICD-10-CM | POA: Diagnosis not present

## 2020-02-08 DIAGNOSIS — E7849 Other hyperlipidemia: Secondary | ICD-10-CM | POA: Diagnosis not present

## 2020-02-16 DIAGNOSIS — M545 Low back pain, unspecified: Secondary | ICD-10-CM | POA: Diagnosis not present

## 2020-02-22 DIAGNOSIS — M4854XA Collapsed vertebra, not elsewhere classified, thoracic region, initial encounter for fracture: Secondary | ICD-10-CM | POA: Diagnosis not present

## 2020-02-29 DIAGNOSIS — S22000A Wedge compression fracture of unspecified thoracic vertebra, initial encounter for closed fracture: Secondary | ICD-10-CM | POA: Diagnosis not present

## 2020-02-29 DIAGNOSIS — M546 Pain in thoracic spine: Secondary | ICD-10-CM | POA: Diagnosis not present

## 2020-03-18 DIAGNOSIS — M546 Pain in thoracic spine: Secondary | ICD-10-CM | POA: Diagnosis not present

## 2020-03-25 DIAGNOSIS — M546 Pain in thoracic spine: Secondary | ICD-10-CM | POA: Diagnosis not present

## 2020-04-21 ENCOUNTER — Emergency Department (HOSPITAL_COMMUNITY)
Admission: EM | Admit: 2020-04-21 | Discharge: 2020-04-21 | Disposition: A | Discharge status: Home / Self Care | Payer: Medicare Other | Attending: Emergency Medicine | Admitting: Emergency Medicine

## 2020-04-21 ENCOUNTER — Other Ambulatory Visit: Payer: Self-pay

## 2020-04-21 ENCOUNTER — Encounter (HOSPITAL_COMMUNITY): Payer: Self-pay | Admitting: *Deleted

## 2020-04-21 ENCOUNTER — Emergency Department (HOSPITAL_COMMUNITY): Payer: Medicare Other

## 2020-04-21 DIAGNOSIS — Z79899 Other long term (current) drug therapy: Secondary | ICD-10-CM | POA: Insufficient documentation

## 2020-04-21 DIAGNOSIS — E876 Hypokalemia: Secondary | ICD-10-CM

## 2020-04-21 DIAGNOSIS — R0789 Other chest pain: Secondary | ICD-10-CM | POA: Diagnosis not present

## 2020-04-21 DIAGNOSIS — Z96643 Presence of artificial hip joint, bilateral: Secondary | ICD-10-CM | POA: Insufficient documentation

## 2020-04-21 DIAGNOSIS — R0781 Pleurodynia: Secondary | ICD-10-CM | POA: Diagnosis not present

## 2020-04-21 DIAGNOSIS — I10 Essential (primary) hypertension: Secondary | ICD-10-CM | POA: Diagnosis not present

## 2020-04-21 LAB — CBC WITH DIFFERENTIAL/PLATELET
Abs Immature Granulocytes: 0.01 10*3/uL (ref 0.00–0.07)
Basophils Absolute: 0 10*3/uL (ref 0.0–0.1)
Basophils Relative: 0 %
Eosinophils Absolute: 0.1 10*3/uL (ref 0.0–0.5)
Eosinophils Relative: 2 %
HCT: 40.3 % (ref 36.0–46.0)
Hemoglobin: 13.6 g/dL (ref 12.0–15.0)
Immature Granulocytes: 0 %
Lymphocytes Relative: 23 %
Lymphs Abs: 1.2 10*3/uL (ref 0.7–4.0)
MCH: 28.7 pg (ref 26.0–34.0)
MCHC: 33.7 g/dL (ref 30.0–36.0)
MCV: 85 fL (ref 80.0–100.0)
Monocytes Absolute: 0.3 10*3/uL (ref 0.1–1.0)
Monocytes Relative: 7 %
Neutro Abs: 3.5 10*3/uL (ref 1.7–7.7)
Neutrophils Relative %: 68 %
Platelets: 207 10*3/uL (ref 150–400)
RBC: 4.74 MIL/uL (ref 3.87–5.11)
RDW: 13.3 % (ref 11.5–15.5)
WBC: 5.2 10*3/uL (ref 4.0–10.5)
nRBC: 0 % (ref 0.0–0.2)

## 2020-04-21 LAB — COMPREHENSIVE METABOLIC PANEL
ALT: 23 U/L (ref 0–44)
AST: 29 U/L (ref 15–41)
Albumin: 4.4 g/dL (ref 3.5–5.0)
Alkaline Phosphatase: 65 U/L (ref 38–126)
Anion gap: 13 (ref 5–15)
BUN: 17 mg/dL (ref 8–23)
CO2: 24 mmol/L (ref 22–32)
Calcium: 9 mg/dL (ref 8.9–10.3)
Chloride: 100 mmol/L (ref 98–111)
Creatinine, Ser: 0.9 mg/dL (ref 0.44–1.00)
GFR, Estimated: >60 mL/min (ref >60)
Glucose, Bld: 137 mg/dL — ABNORMAL HIGH (ref 70–99)
Potassium: 3 mmol/L — ABNORMAL LOW (ref 3.5–5.1)
Sodium: 137 mmol/L (ref 135–145)
Total Bilirubin: 0.7 mg/dL (ref 0.3–1.2)
Total Protein: 7.4 g/dL (ref 6.5–8.1)

## 2020-04-21 LAB — URINALYSIS, ROUTINE W REFLEX MICROSCOPIC
Bacteria, UA: NONE SEEN
Bilirubin Urine: NEGATIVE
Glucose, UA: NEGATIVE mg/dL
Hgb urine dipstick: NEGATIVE
Ketones, ur: 5 mg/dL — AB
Nitrite: NEGATIVE
Protein, ur: NEGATIVE mg/dL
Specific Gravity, Urine: 1.02 (ref 1.005–1.030)
pH: 5 (ref 5.0–8.0)

## 2020-04-21 LAB — LIPASE, BLOOD: Lipase: 34 U/L (ref 11–51)

## 2020-04-21 MED ORDER — PREDNISONE 10 MG PO TABS: ORAL_TABLET | ORAL | 0 refills | Status: DC

## 2020-04-21 MED ORDER — LIDOCAINE 5 % EX PTCH: 1.0000 | MEDICATED_PATCH | CUTANEOUS | 0 refills | Status: DC

## 2020-04-21 MED ORDER — PREDNISONE 50 MG PO TABS
60.0000 mg | ORAL_TABLET | Freq: Once | ORAL | Status: AC
  Administered 2020-04-21: 60 mg via ORAL
  Filled 2020-04-21: qty 1

## 2020-04-21 MED ORDER — POTASSIUM CHLORIDE CRYS ER 20 MEQ PO TBCR: 20.0000 meq | EXTENDED_RELEASE_TABLET | Freq: Two times a day (BID) | ORAL | 0 refills | Status: AC

## 2020-04-21 MED ORDER — LIDOCAINE 5 % EX PTCH
1.0000 | MEDICATED_PATCH | CUTANEOUS | Status: DC
  Administered 2020-04-21: 1 via TRANSDERMAL
  Filled 2020-04-21: qty 1

## 2020-04-21 NOTE — Discharge Instructions (Addendum)
Your x-rays today are negative for any acute or evidence of healing fractures.  Given the chronicity of your symptoms is possible you have a chronic Inflammation of the cartilage of your rib cage which can also be very painful.  I recommend applying a heating pad to the site for 20 minutes 3-4 times daily.  You have been placed on a prednisone taper to see if this will help relieve your symptoms.  The Lidoderm patch also can help with pain relief.  Plan to see your primary doctor or your orthopedic doctor if your symptoms do not improve with this treatment plan.  Also, your potassium level is low, probably a result of your diuretic medication.  You have been prescribed a small course of potassium tablets to help replace this.  I recommend increasing your consumption of foods with potassium to help prevent this in the future (as long as you are on your diuretic).  Bananas and orange juice are good foods for this.

## 2020-04-21 NOTE — ED Triage Notes (Signed)
Emergency Medicine Provider Triage Evaluation Note  Mariadelaluz [place] , a 65 y.o. female  was evaluated in triage.  Pt complains of having some left upper quadrant pain for the last 4 days.  Prior history of gallbladder disease, last bowel movement was yesterday, they have been normal, no urinary symptoms  Review of Systems  Positive: Abdominal pain Negative: Fevers, chills, nausea, vomiting, diarrhea,  Physical Exam  BP (!) 145/73 (BP Location: Right Arm)   Pulse 80   Temp 98.4 F (36.9 C) (Oral)   Resp 16   Ht 1.549 m (5\' 1" )   SpO2 95%   BMI 41.00 kg/m  Gen:   Awake, no distress   HEENT:  Atraumatic  Resp:  Normal effort  Cardiac:  Normal rate  Abd:   Nondistended, nontender  MSK:   Moves extremities without difficulty  Neuro:  Speech clear   Patient has no abdominal tenderness to palpation  Medical Decision Making  Medically screening exam initiated at 3:19 PM.  Appropriate orders placed.  Reniya Mcclees was informed that the remainder of the evaluation will be completed by another provider, this initial triage assessment does not replace that evaluation, and the importance of remaining in the ED until their evaluation is complete.  Clinical Impression  Abdominal pain   [name], MD 04/21/20 1520

## 2020-04-21 NOTE — ED Triage Notes (Signed)
Left flank pain x 2 days
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
## 2020-05-07 DIAGNOSIS — I10 Essential (primary) hypertension: Secondary | ICD-10-CM | POA: Diagnosis not present

## 2020-05-07 DIAGNOSIS — E7849 Other hyperlipidemia: Secondary | ICD-10-CM | POA: Diagnosis not present

## 2020-05-07 DIAGNOSIS — M545 Low back pain, unspecified: Secondary | ICD-10-CM | POA: Diagnosis not present

## 2020-05-10 DIAGNOSIS — M545 Low back pain, unspecified: Secondary | ICD-10-CM | POA: Diagnosis not present

## 2020-05-10 DIAGNOSIS — I10 Essential (primary) hypertension: Secondary | ICD-10-CM | POA: Diagnosis not present

## 2020-05-10 DIAGNOSIS — Z0000 Encounter for general adult medical examination without abnormal findings: Secondary | ICD-10-CM | POA: Diagnosis not present

## 2020-05-10 DIAGNOSIS — E7849 Other hyperlipidemia: Secondary | ICD-10-CM | POA: Diagnosis not present

## 2020-05-24 DIAGNOSIS — G5601 Carpal tunnel syndrome, right upper limb: Secondary | ICD-10-CM | POA: Diagnosis not present

## 2020-08-07 DIAGNOSIS — M545 Low back pain, unspecified: Secondary | ICD-10-CM | POA: Diagnosis not present

## 2020-08-07 DIAGNOSIS — I10 Essential (primary) hypertension: Secondary | ICD-10-CM | POA: Diagnosis not present

## 2020-08-07 DIAGNOSIS — I700 Atherosclerosis of aorta: Secondary | ICD-10-CM | POA: Diagnosis not present

## 2020-08-07 DIAGNOSIS — E7849 Other hyperlipidemia: Secondary | ICD-10-CM | POA: Diagnosis not present

## 2020-08-10 DIAGNOSIS — M545 Low back pain, unspecified: Secondary | ICD-10-CM | POA: Diagnosis not present

## 2020-08-10 DIAGNOSIS — I10 Essential (primary) hypertension: Secondary | ICD-10-CM | POA: Diagnosis not present

## 2020-08-10 DIAGNOSIS — I700 Atherosclerosis of aorta: Secondary | ICD-10-CM | POA: Diagnosis not present

## 2020-08-10 DIAGNOSIS — Z0000 Encounter for general adult medical examination without abnormal findings: Secondary | ICD-10-CM | POA: Diagnosis not present

## 2020-08-10 DIAGNOSIS — E7849 Other hyperlipidemia: Secondary | ICD-10-CM | POA: Diagnosis not present

## 2020-08-22 DIAGNOSIS — Z1231 Encounter for screening mammogram for malignant neoplasm of breast: Secondary | ICD-10-CM | POA: Diagnosis not present

## 2020-09-05 ENCOUNTER — Ambulatory Visit (INDEPENDENT_AMBULATORY_CARE_PROVIDER_SITE_OTHER): Payer: Medicare Other | Admitting: Obstetrics & Gynecology

## 2020-09-05 ENCOUNTER — Encounter: Payer: Self-pay | Admitting: Obstetrics & Gynecology

## 2020-09-05 ENCOUNTER — Other Ambulatory Visit: Payer: Self-pay

## 2020-09-05 VITALS — BP 129/74 | HR 63 | Wt 207.0 lb

## 2020-09-05 DIAGNOSIS — Z4689 Encounter for fitting and adjustment of other specified devices: Secondary | ICD-10-CM

## 2020-09-05 NOTE — Progress Notes (Signed)
Chief Complaint  Patient presents with   Pessary Check    Blood pressure 129/74, pulse 63, weight 207 lb (93.9 kg).  Sharon Chung presents today for routine follow up related to her pessary.   She uses a Milex ring with support #4 She reports no vaginal discharge and no vaginal bleeding   Likert scale(1 not bothersome -5 very bothersome)  :  1  Exam reveals no undue vaginal mucosal pressure of breakdown, no discharge and no vaginal bleeding.  Vaginal Epithelial Abnormality Classification System:   0 0    No abnormalities 1    Epithelial erythema 2    Granulation tissue 3    Epithelial break or erosion, 1 cm or less 4    Epithelial break or erosion, 1 cm or greater  The pessary is removed, cleaned and replaced without difficulty.      ICD-10-CM   1. Pessary maintenance, Milex ring with support #4  Z46.89        [name] will be sen back in 4 months for continued follow up.  Lazaro Arms, MD  09/05/2020 2:15 PM

## 2020-11-07 DIAGNOSIS — M545 Low back pain, unspecified: Secondary | ICD-10-CM | POA: Diagnosis not present

## 2020-11-07 DIAGNOSIS — I10 Essential (primary) hypertension: Secondary | ICD-10-CM | POA: Diagnosis not present

## 2020-11-07 DIAGNOSIS — E7849 Other hyperlipidemia: Secondary | ICD-10-CM | POA: Diagnosis not present

## 2020-11-07 DIAGNOSIS — I700 Atherosclerosis of aorta: Secondary | ICD-10-CM | POA: Diagnosis not present

## 2020-11-16 DIAGNOSIS — E7849 Other hyperlipidemia: Secondary | ICD-10-CM | POA: Diagnosis not present

## 2020-11-16 DIAGNOSIS — M545 Low back pain, unspecified: Secondary | ICD-10-CM | POA: Diagnosis not present

## 2020-11-16 DIAGNOSIS — I700 Atherosclerosis of aorta: Secondary | ICD-10-CM | POA: Diagnosis not present

## 2020-11-16 DIAGNOSIS — I10 Essential (primary) hypertension: Secondary | ICD-10-CM | POA: Diagnosis not present

## 2020-12-06 DIAGNOSIS — Z780 Asymptomatic menopausal state: Secondary | ICD-10-CM | POA: Diagnosis not present

## 2020-12-06 DIAGNOSIS — M810 Age-related osteoporosis without current pathological fracture: Secondary | ICD-10-CM | POA: Diagnosis not present

## 2021-01-05 ENCOUNTER — Ambulatory Visit: Payer: Medicare Other | Admitting: Obstetrics & Gynecology

## 2021-02-03 DIAGNOSIS — G5601 Carpal tunnel syndrome, right upper limb: Secondary | ICD-10-CM | POA: Diagnosis not present

## 2021-02-03 DIAGNOSIS — G5603 Carpal tunnel syndrome, bilateral upper limbs: Secondary | ICD-10-CM | POA: Diagnosis not present

## 2021-02-16 DIAGNOSIS — M545 Low back pain, unspecified: Secondary | ICD-10-CM | POA: Diagnosis not present

## 2021-02-16 DIAGNOSIS — Z0000 Encounter for general adult medical examination without abnormal findings: Secondary | ICD-10-CM | POA: Diagnosis not present

## 2021-02-16 DIAGNOSIS — I10 Essential (primary) hypertension: Secondary | ICD-10-CM | POA: Diagnosis not present

## 2021-02-16 DIAGNOSIS — E7849 Other hyperlipidemia: Secondary | ICD-10-CM | POA: Diagnosis not present

## 2021-02-16 DIAGNOSIS — I700 Atherosclerosis of aorta: Secondary | ICD-10-CM | POA: Diagnosis not present

## 2021-03-28 DIAGNOSIS — G5601 Carpal tunnel syndrome, right upper limb: Secondary | ICD-10-CM | POA: Diagnosis not present

## 2021-03-28 DIAGNOSIS — G5602 Carpal tunnel syndrome, left upper limb: Secondary | ICD-10-CM | POA: Diagnosis not present

## 2021-04-27 DIAGNOSIS — G5603 Carpal tunnel syndrome, bilateral upper limbs: Secondary | ICD-10-CM | POA: Diagnosis not present

## 2021-05-15 DIAGNOSIS — G5603 Carpal tunnel syndrome, bilateral upper limbs: Secondary | ICD-10-CM | POA: Diagnosis not present

## 2021-05-18 DIAGNOSIS — M5459 Other low back pain: Secondary | ICD-10-CM | POA: Diagnosis not present

## 2021-05-18 DIAGNOSIS — I10 Essential (primary) hypertension: Secondary | ICD-10-CM | POA: Diagnosis not present

## 2021-05-18 DIAGNOSIS — I700 Atherosclerosis of aorta: Secondary | ICD-10-CM | POA: Diagnosis not present

## 2021-05-18 DIAGNOSIS — E7849 Other hyperlipidemia: Secondary | ICD-10-CM | POA: Diagnosis not present

## 2021-05-18 DIAGNOSIS — Z0000 Encounter for general adult medical examination without abnormal findings: Secondary | ICD-10-CM | POA: Diagnosis not present

## 2021-05-23 DIAGNOSIS — G5603 Carpal tunnel syndrome, bilateral upper limbs: Secondary | ICD-10-CM | POA: Diagnosis not present

## 2021-06-01 DIAGNOSIS — G5601 Carpal tunnel syndrome, right upper limb: Secondary | ICD-10-CM | POA: Diagnosis not present

## 2021-06-13 DIAGNOSIS — G5601 Carpal tunnel syndrome, right upper limb: Secondary | ICD-10-CM | POA: Diagnosis not present

## 2021-08-28 DIAGNOSIS — E7849 Other hyperlipidemia: Secondary | ICD-10-CM | POA: Diagnosis not present

## 2021-08-28 DIAGNOSIS — I10 Essential (primary) hypertension: Secondary | ICD-10-CM | POA: Diagnosis not present

## 2021-08-28 DIAGNOSIS — M5459 Other low back pain: Secondary | ICD-10-CM | POA: Diagnosis not present

## 2021-08-28 DIAGNOSIS — I700 Atherosclerosis of aorta: Secondary | ICD-10-CM | POA: Diagnosis not present

## 2021-09-15 ENCOUNTER — Ambulatory Visit (INDEPENDENT_AMBULATORY_CARE_PROVIDER_SITE_OTHER): Payer: Medicare Other | Admitting: Obstetrics & Gynecology

## 2021-09-15 ENCOUNTER — Encounter: Payer: Self-pay | Admitting: Obstetrics & Gynecology

## 2021-09-15 VITALS — BP 129/77 | HR 79 | Ht 61.0 in | Wt 199.0 lb

## 2021-09-15 DIAGNOSIS — Z4689 Encounter for fitting and adjustment of other specified devices: Secondary | ICD-10-CM

## 2021-09-15 NOTE — Progress Notes (Signed)
Chief Complaint  Patient presents with   Pessary Maintenance    Blood pressure 129/77, pulse 79, height 5\' 1"  (1.549 m), weight 199 lb (90.3 kg).  Sharon Chung presents today for routine follow up related to her pessary.   She uses a Milex ring with support #4 She reports no vaginal discharge and no vaginal bleeding   Likert scale(1 not bothersome -5 very bothersome)  :  1  Exam reveals no undue vaginal mucosal pressure of breakdown, no discharge and no vaginal bleeding.  Vaginal Epithelial Abnormality Classification System:   0 0    No abnormalities 1    Epithelial erythema 2    Granulation tissue 3    Epithelial break or erosion, 1 cm or less 4    Epithelial break or erosion, 1 cm or greater  The pessary is removed, cleaned and replaced without difficulty.      ICD-10-CM   1. Pessary maintenance, Milex ring with support #4  Z46.89        Caitlen Worth will be sen back in 6 months for continued follow up.  [name], MD  09/15/2021 10:53 AM

## 2021-10-13 DIAGNOSIS — G5602 Carpal tunnel syndrome, left upper limb: Secondary | ICD-10-CM | POA: Diagnosis not present

## 2021-12-04 DIAGNOSIS — I700 Atherosclerosis of aorta: Secondary | ICD-10-CM | POA: Diagnosis not present

## 2021-12-04 DIAGNOSIS — I10 Essential (primary) hypertension: Secondary | ICD-10-CM | POA: Diagnosis not present

## 2021-12-04 DIAGNOSIS — E7849 Other hyperlipidemia: Secondary | ICD-10-CM | POA: Diagnosis not present

## 2021-12-04 DIAGNOSIS — M5459 Other low back pain: Secondary | ICD-10-CM | POA: Diagnosis not present

## 2021-12-27 DIAGNOSIS — G5602 Carpal tunnel syndrome, left upper limb: Secondary | ICD-10-CM | POA: Diagnosis not present

## 2022-01-26 DIAGNOSIS — G5602 Carpal tunnel syndrome, left upper limb: Secondary | ICD-10-CM | POA: Diagnosis not present

## 2022-02-08 DIAGNOSIS — G5602 Carpal tunnel syndrome, left upper limb: Secondary | ICD-10-CM | POA: Diagnosis not present

## 2022-02-12 DIAGNOSIS — M25571 Pain in right ankle and joints of right foot: Secondary | ICD-10-CM | POA: Diagnosis not present

## 2022-02-12 DIAGNOSIS — M76821 Posterior tibial tendinitis, right leg: Secondary | ICD-10-CM | POA: Diagnosis not present

## 2022-03-07 DIAGNOSIS — Z0000 Encounter for general adult medical examination without abnormal findings: Secondary | ICD-10-CM | POA: Diagnosis not present

## 2022-03-07 DIAGNOSIS — E7849 Other hyperlipidemia: Secondary | ICD-10-CM | POA: Diagnosis not present

## 2022-03-07 DIAGNOSIS — M5459 Other low back pain: Secondary | ICD-10-CM | POA: Diagnosis not present

## 2022-03-07 DIAGNOSIS — I10 Essential (primary) hypertension: Secondary | ICD-10-CM | POA: Diagnosis not present

## 2022-03-07 DIAGNOSIS — I700 Atherosclerosis of aorta: Secondary | ICD-10-CM | POA: Diagnosis not present

## 2022-06-18 DIAGNOSIS — M5459 Other low back pain: Secondary | ICD-10-CM | POA: Diagnosis not present

## 2022-06-18 DIAGNOSIS — N390 Urinary tract infection, site not specified: Secondary | ICD-10-CM | POA: Diagnosis not present

## 2022-06-18 DIAGNOSIS — I10 Essential (primary) hypertension: Secondary | ICD-10-CM | POA: Diagnosis not present

## 2022-06-18 DIAGNOSIS — Z0000 Encounter for general adult medical examination without abnormal findings: Secondary | ICD-10-CM | POA: Diagnosis not present

## 2022-06-18 DIAGNOSIS — E7849 Other hyperlipidemia: Secondary | ICD-10-CM | POA: Diagnosis not present

## 2022-06-18 DIAGNOSIS — I700 Atherosclerosis of aorta: Secondary | ICD-10-CM | POA: Diagnosis not present

## 2022-06-28 DIAGNOSIS — Z1231 Encounter for screening mammogram for malignant neoplasm of breast: Secondary | ICD-10-CM | POA: Diagnosis not present

## 2022-07-27 ENCOUNTER — Ambulatory Visit (INDEPENDENT_AMBULATORY_CARE_PROVIDER_SITE_OTHER): Payer: 59 | Admitting: Obstetrics & Gynecology

## 2022-07-27 ENCOUNTER — Other Ambulatory Visit (HOSPITAL_COMMUNITY): Admission: RE | Admit: 2022-07-27 | Payer: 59 | Source: Ambulatory Visit | Admitting: Obstetrics & Gynecology

## 2022-07-27 ENCOUNTER — Encounter: Payer: Self-pay | Admitting: Obstetrics & Gynecology

## 2022-07-27 VITALS — BP 153/74 | HR 72 | Ht 61.0 in | Wt 208.0 lb

## 2022-07-27 DIAGNOSIS — Z1151 Encounter for screening for human papillomavirus (HPV): Secondary | ICD-10-CM | POA: Diagnosis not present

## 2022-07-27 DIAGNOSIS — Z01419 Encounter for gynecological examination (general) (routine) without abnormal findings: Secondary | ICD-10-CM

## 2022-07-27 NOTE — Progress Notes (Signed)
Subjective:     Sharon Chung is a 67 y.o. female here for a routine exam.  No LMP recorded. Patient is postmenopausal. G1P1001 Birth Control Method:  menopausal Menstrual Calendar(currently): na  Current complaints: none.   Current acute medical issues:  none   Recent Gynecologic History No LMP recorded. Patient is postmenopausal. Last Pap: 2021,  normal Last mammogram: 2024,  normal  Past Medical History:  Diagnosis Date   Arthritis    H/O hiatal hernia    Hypercholesterolemia    Hypertension     Past Surgical History:  Procedure Laterality Date   CARPAL TUNNEL RELEASE Bilateral    CHOLECYSTECTOMY N/A 05/26/2018   Procedure: LAPAROSCOPIC CHOLECYSTECTOMY;  Surgeon: Lucretia Roers, MD;  Location: [facility];  Service: General;  Laterality: N/A;   JOINT REPLACEMENT Right 03/2017   Right THA.   TOTAL HIP ARTHROPLASTY Left 08/18/2013   Procedure: LEFT TOTAL HIP ARTHROPLASTY ANTERIOR APPROACH;  Surgeon: Shelda Pal, MD;  Location: [facility];  Service: Orthopedics;  Laterality: Left;   TUBAL LIGATION      OB History     Gravida  1   Para  1   Term  1   Preterm      AB      Living  1      SAB      IAB      Ectopic      Multiple      Live Births  1           Social History   Socioeconomic History   Marital status: Divorced    Spouse name: Not on file   Number of children: Not on file   Years of education: Not on file   Highest education level: Not on file  Occupational History   Not on file  Tobacco Use   Smoking status: Never   Smokeless tobacco: Never  Vaping Use   Vaping status: Never Used  Substance and Sexual Activity   Alcohol use: No   Drug use: No   Sexual activity: Not Currently    Birth control/protection: Post-menopausal, Surgical    Comment: tubal  Other Topics Concern   Not on file  Social History Narrative   Not on file   Social Determinants of Health   Financial Resource Strain: Low Risk  (07/27/2022)   Overall  Financial Resource Strain (CARDIA)    Difficulty of Paying Living Expenses: Not hard at all  Food Insecurity: No Food Insecurity (07/27/2022)   Hunger Vital Sign    Worried About Running Out of Food in the Last Year: Never true    Ran Out of Food in the Last Year: Never true  Transportation Needs: No Transportation Needs (07/27/2022)   PRAPARE - Administrator, Civil Service (Medical): No    Lack of Transportation (Non-Medical): No  Physical Activity: Insufficiently Active (07/27/2022)   Exercise Vital Sign    Days of Exercise per Week: 3 days    Minutes of Exercise per Session: 20 min  Stress: No Stress Concern Present (07/27/2022)   [employer] of Occupational Health - Occupational Stress Questionnaire    Feeling of Stress : Not at all  Social Connections: Moderately Integrated (07/27/2022)   Social Connection and Isolation Panel [NHANES]    Frequency of Communication with Friends and Family: More than three times a week    Frequency of Social Gatherings with Friends and Family: More than three times a week  Attends Religious Services: More than 4 times per year    Active Member of Clubs or Organizations: Yes    Attends Banker Meetings: More than 4 times per year    Marital Status: Divorced    Family History  Problem Relation Age of Onset   Heart attack Paternal Grandfather    Leukemia Paternal Grandmother    Heart attack Maternal Grandmother    Heart attack Maternal Grandfather    Heart attack Father    Heart attack Mother    Arthritis Mother    Irregular heart beat Mother      Current Outpatient Medications:    ALPRAZolam (XANAX) 0.5 MG tablet, Take 0.25 mg by mouth at bedtime as needed. , Disp: , Rfl:    aspirin 81 MG chewable tablet, Chew 1 tablet twice a day by oral route as directed for 30 days., Disp: , Rfl:    Cholecalciferol (VITAMIN D3) 3000 UNITS TABS, Take 1,000 Units by mouth. Take 1 tab daily, Disp: , Rfl:     olmesartan-hydrochlorothiazide (BENICAR HCT) 40-12.5 MG tablet, Take 1 tablet by mouth daily., Disp: , Rfl:    pantoprazole (PROTONIX) 40 MG tablet, Take 40 mg by mouth daily., Disp: , Rfl:    potassium chloride SA (KLOR-CON) 20 MEQ tablet, Take 1 tablet (20 mEq total) by mouth 2 (two) times daily., Disp: 14 tablet, Rfl: 0   pravastatin (PRAVACHOL) 10 MG tablet, Take by mouth., Disp: , Rfl:    lidocaine (LIDODERM) 5 %, Place 1 patch onto the skin daily. Remove & Discard patch within 12 hours or as directed by MD (Patient not taking: Reported on 09/15/2021), Disp: 30 patch, Rfl: 0  Review of Systems  Review of Systems  Constitutional: Negative for fever, chills, weight loss, malaise/fatigue and diaphoresis.  HENT: Negative for hearing loss, ear pain, nosebleeds, congestion, sore throat, neck pain, tinnitus and ear discharge.   Eyes: Negative for blurred vision, double vision, photophobia, pain, discharge and redness.  Respiratory: Negative for cough, hemoptysis, sputum production, shortness of breath, wheezing and stridor.   Cardiovascular: Negative for chest pain, palpitations, orthopnea, claudication, leg swelling and PND.  Gastrointestinal: negative for abdominal pain. Negative for heartburn, nausea, vomiting, diarrhea, constipation, blood in stool and melena.  Genitourinary: Negative for dysuria, urgency, frequency, hematuria and flank pain.  Musculoskeletal: Negative for myalgias, back pain, joint pain and falls.  Skin: Negative for itching and rash.  Neurological: Negative for dizziness, tingling, tremors, sensory change, speech change, focal weakness, seizures, loss of consciousness, weakness and headaches.  Endo/Heme/Allergies: Negative for environmental allergies and polydipsia. Does not bruise/bleed easily.  Psychiatric/Behavioral: Negative for depression, suicidal ideas, hallucinations, memory loss and substance abuse. The patient is not nervous/anxious and does not have insomnia.         Objective:  Blood pressure (!) 153/74, pulse 72, height 5\' 1"  (1.549 m), weight 208 lb (94.3 kg).   Physical Exam  Vitals reviewed. Constitutional: She is oriented to person, place, and time. She appears well-developed and well-nourished.  HENT:  Head: Normocephalic and atraumatic.        Right Ear: External ear normal.  Left Ear: External ear normal.  Nose: Nose normal.  Mouth/Throat: Oropharynx is clear and moist.  Eyes: Conjunctivae and EOM are normal. Pupils are equal, round, and reactive to light. Right eye exhibits no discharge. Left eye exhibits no discharge. No scleral icterus.  Neck: Normal range of motion. Neck supple. No tracheal deviation present. No thyromegaly present.  Cardiovascular: Normal rate, regular  rhythm, normal heart sounds and intact distal pulses.  Exam reveals no gallop and no friction rub.   No murmur heard. Respiratory: Effort normal and breath sounds normal. No respiratory distress. She has no wheezes. She has no rales. She exhibits no tenderness.  GI: Soft. Bowel sounds are normal. She exhibits no distension and no mass. There is no tenderness. There is no rebound and no guarding.  Genitourinary:  Breasts no masses skin changes or nipple changes bilaterally      Vulva is normal without lesions Vagina is pink moist without discharge Cervix normal in appearance and pap is done Uterus is normal size shape and contour Adnexa is negative with normal sized ovaries  Pessary changed out Musculoskeletal: Normal range of motion. She exhibits no edema and no tenderness.  Neurological: She is alert and oriented to person, place, and time. She has normal reflexes. She displays normal reflexes. No cranial nerve deficit. She exhibits normal muscle tone. Coordination normal.  Skin: Skin is warm and dry. No rash noted. No erythema. No pallor.  Psychiatric: She has a normal mood and affect. Her behavior is normal. Judgment and thought content normal.        Medications Ordered at today's visit: No orders of the defined types were placed in this encounter.   Other orders placed at today's visit: No orders of the defined types were placed in this encounter.     Assessment:    Normal Gyn exam.   Pessary for POP, removed/cleaned/replaced Plan:    Follow up in: 4 months.     No follow-ups on file.

## 2022-08-01 LAB — CYTOLOGY - PAP
Comment: NEGATIVE
Comment: NEGATIVE
Comment: NEGATIVE
Diagnosis: UNDETERMINED — AB
HPV 16: NEGATIVE
HPV 18 / 45: NEGATIVE
High risk HPV: POSITIVE — AB

## 2022-08-02 ENCOUNTER — Telehealth: Payer: Self-pay | Admitting: *Deleted

## 2022-08-02 NOTE — Telephone Encounter (Signed)
Called patient regarding abnormal pap smear. Informed based on current guidelines, colposcopy was indicated.  Explained procedure and all questions answered. Patient declines to have procedure and wants to wait and repeat pap smear in 1 year.  Advised if she changed her mind, to call us.

## 2022-09-17 DIAGNOSIS — I10 Essential (primary) hypertension: Secondary | ICD-10-CM | POA: Diagnosis not present

## 2022-09-17 DIAGNOSIS — Z0000 Encounter for general adult medical examination without abnormal findings: Secondary | ICD-10-CM | POA: Diagnosis not present

## 2022-09-17 DIAGNOSIS — M5459 Other low back pain: Secondary | ICD-10-CM | POA: Diagnosis not present

## 2022-09-17 DIAGNOSIS — I700 Atherosclerosis of aorta: Secondary | ICD-10-CM | POA: Diagnosis not present

## 2022-09-17 DIAGNOSIS — E7849 Other hyperlipidemia: Secondary | ICD-10-CM | POA: Diagnosis not present

## 2022-10-02 IMAGING — DX DG RIBS W/ CHEST 3+V*L*
4 series · 4 of 4 positions shown · non-contrast
Comparison: 05/23/2018

CLINICAL DATA: Fall 2 months ago, left lower rib pain since

EXAM:
LEFT RIBS AND CHEST - 3+ VIEW

[chest pa]
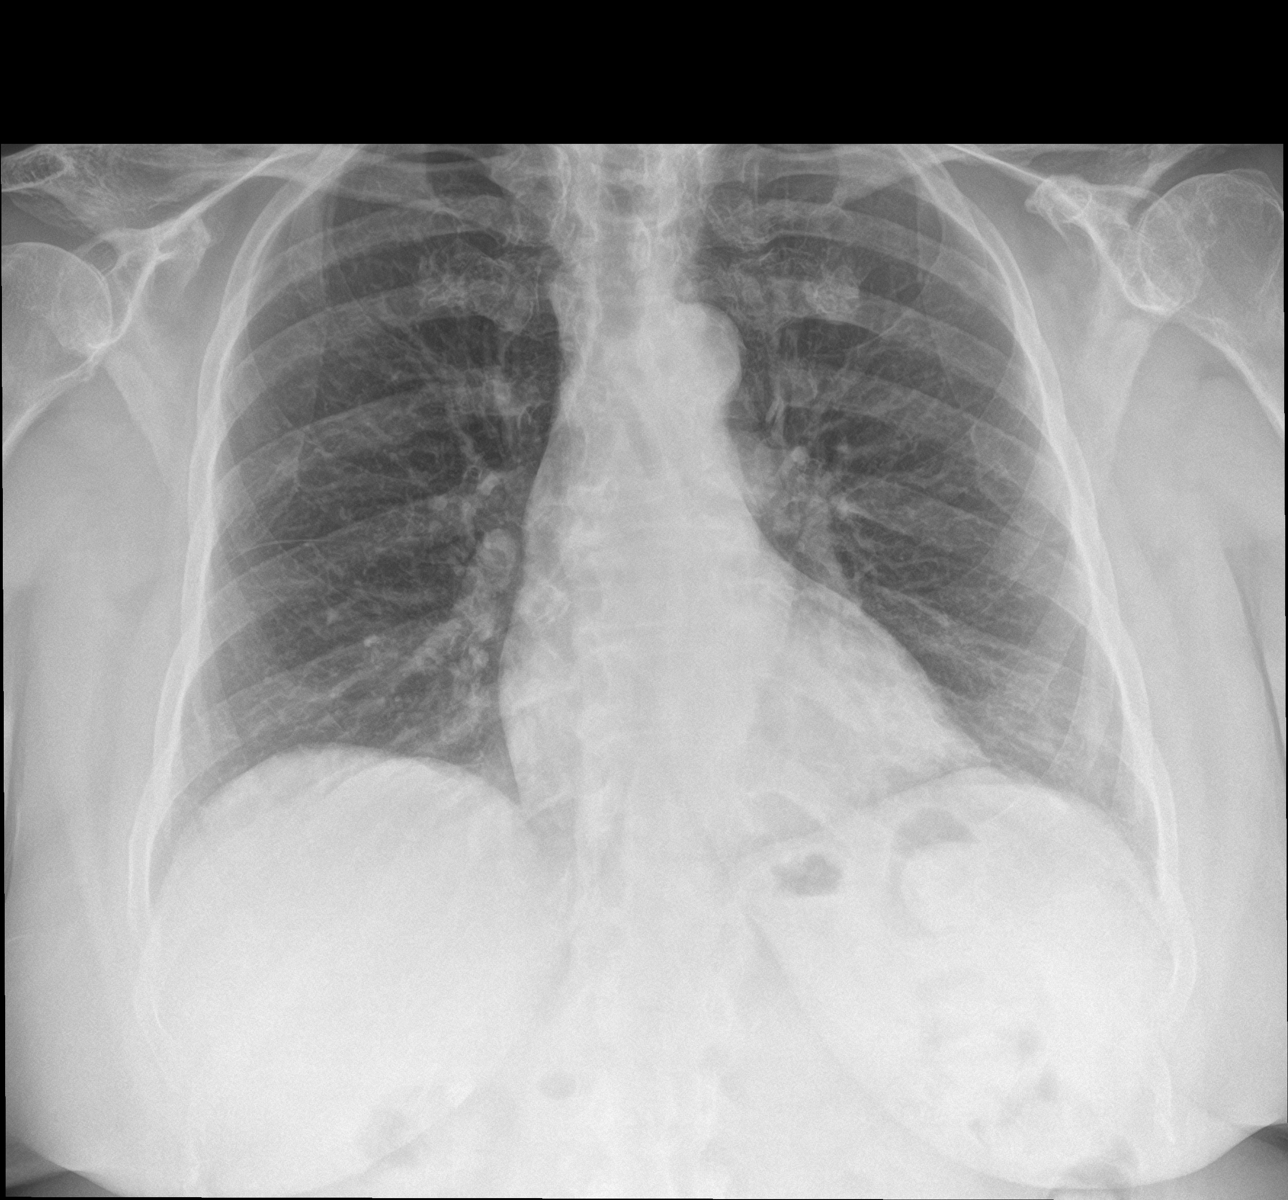

[rib pa obl]
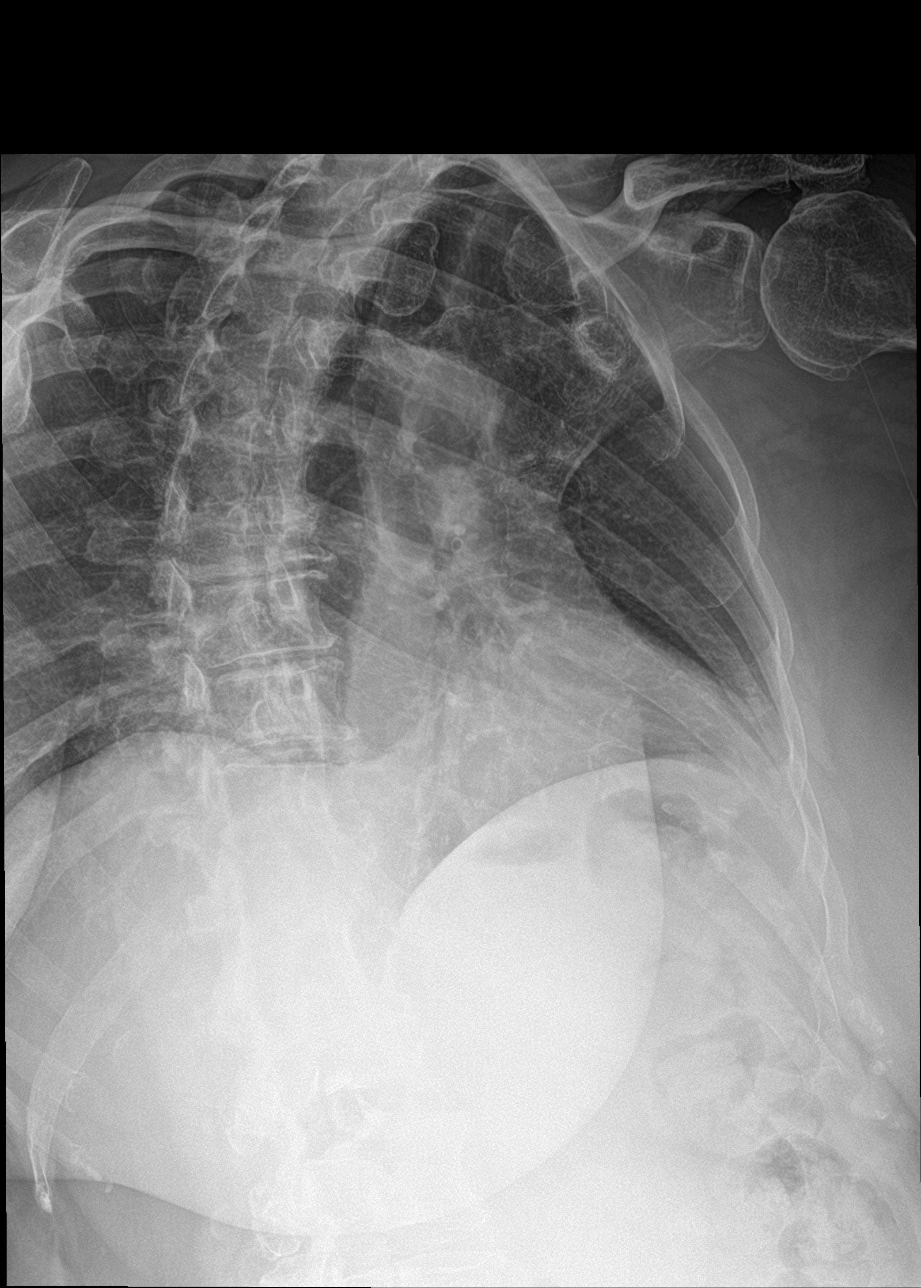

[rib pa]
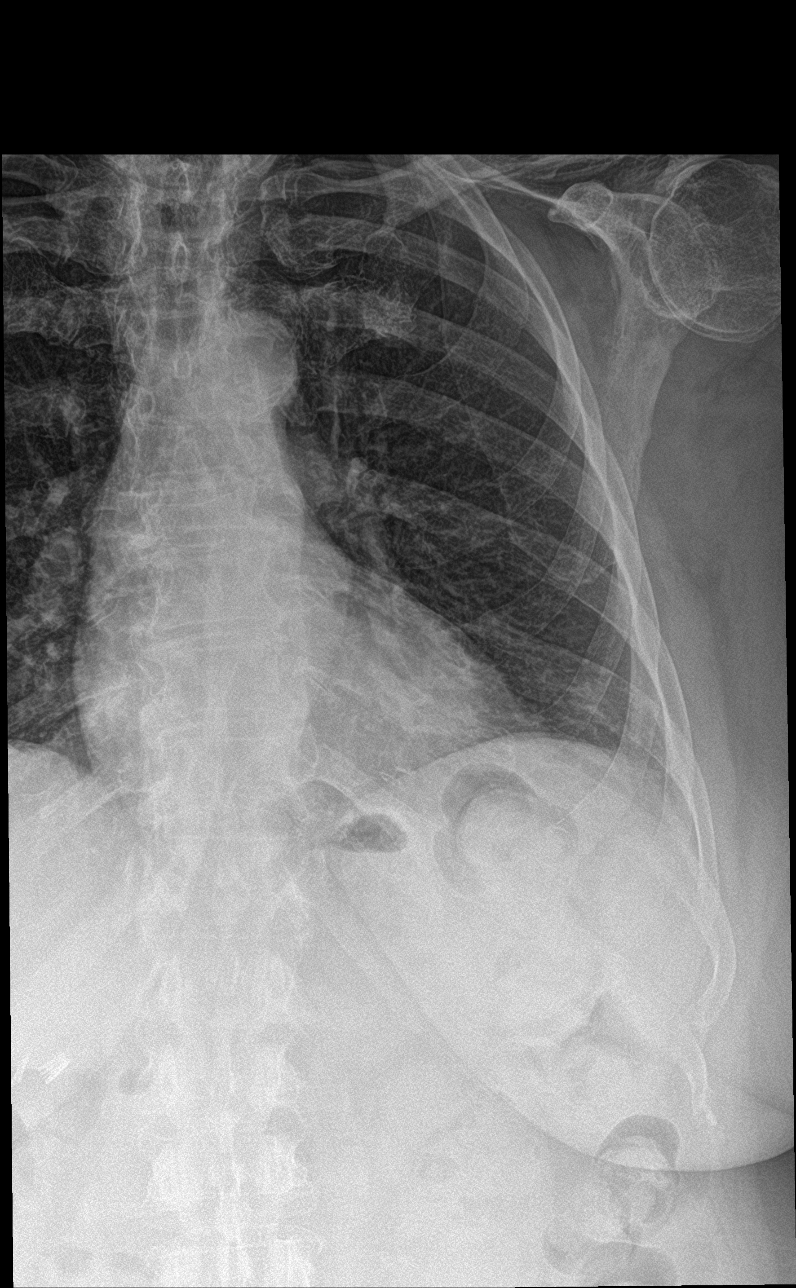

[rib ap]
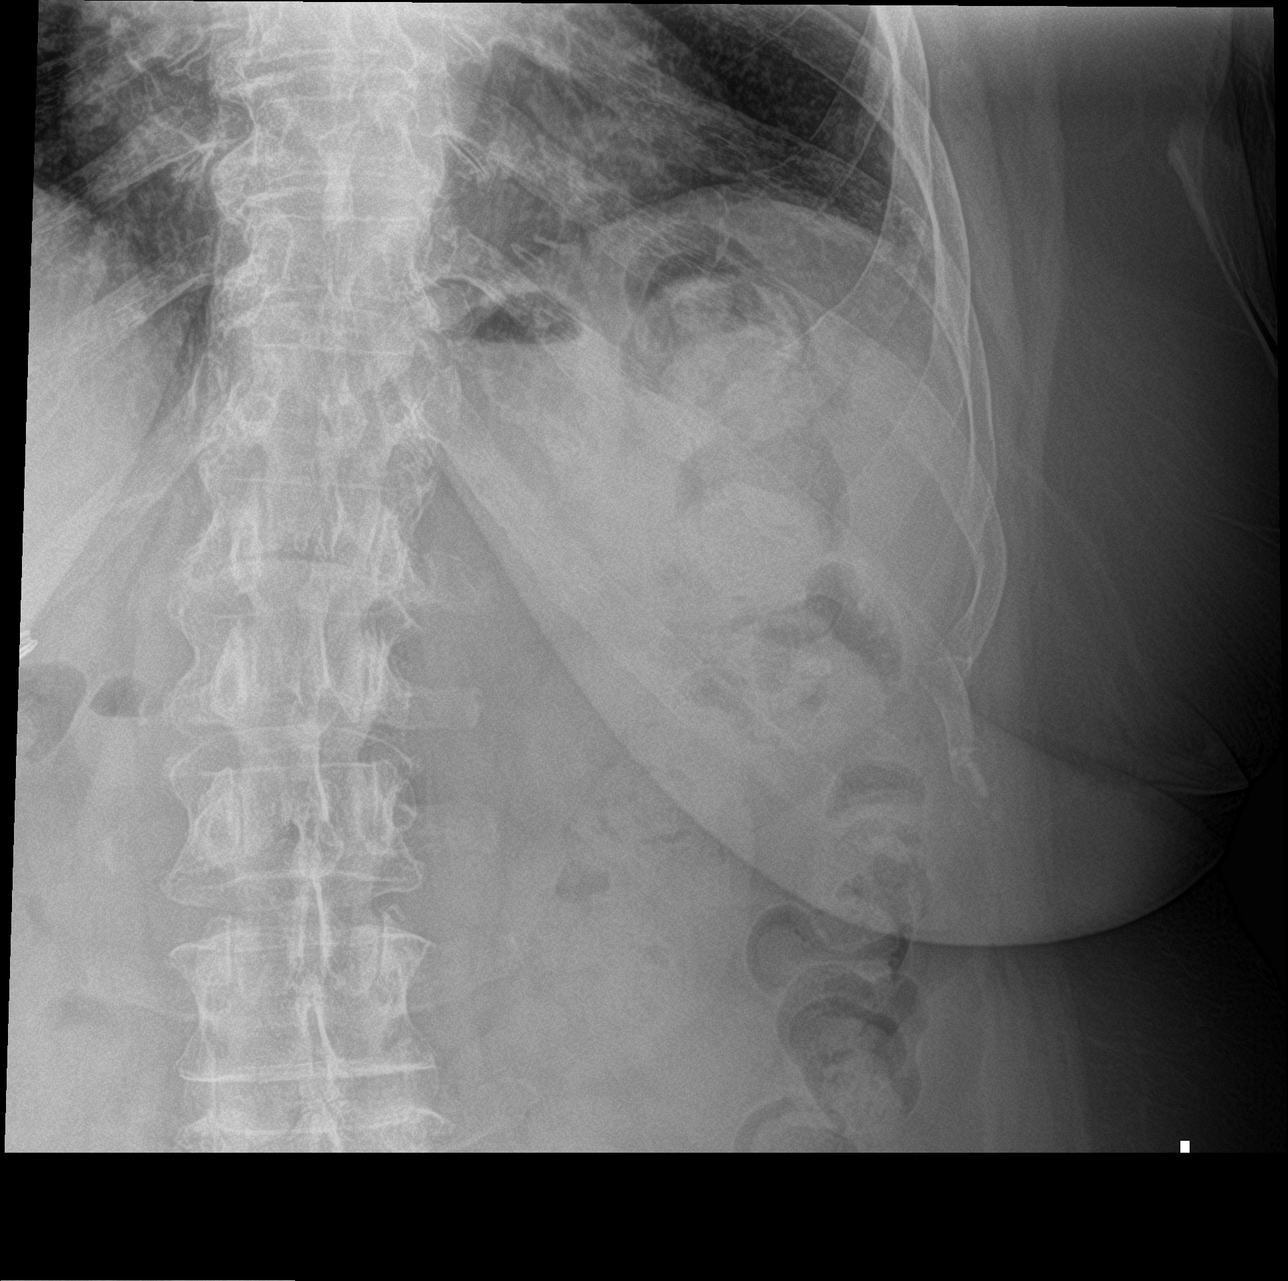

[4 of 4 positions shown; findings below may reference images not displayed]

FINDINGS: No fracture or other bone lesions are seen involving the ribs. There
is no evidence of pneumothorax or pleural effusion. Both lungs are
clear. Heart size and mediastinal contours are within normal limits.
IMPRESSION: No displaced fracture or other radiographic abnormality of the ribs
to explain pain. No acute abnormality of the lungs.

## 2022-10-11 ENCOUNTER — Encounter: Payer: Self-pay | Admitting: Obstetrics & Gynecology

## 2022-10-11 ENCOUNTER — Ambulatory Visit (INDEPENDENT_AMBULATORY_CARE_PROVIDER_SITE_OTHER): Payer: 59 | Admitting: Obstetrics & Gynecology

## 2022-10-11 VITALS — BP 149/71 | HR 75 | Ht 61.0 in | Wt 213.0 lb

## 2022-10-11 DIAGNOSIS — N3941 Urge incontinence: Secondary | ICD-10-CM | POA: Diagnosis not present

## 2022-10-11 DIAGNOSIS — N3281 Overactive bladder: Secondary | ICD-10-CM

## 2022-10-11 DIAGNOSIS — Z4689 Encounter for fitting and adjustment of other specified devices: Secondary | ICD-10-CM

## 2022-10-11 MED ORDER — SOLIFENACIN SUCCINATE 10 MG PO TABS: ORAL_TABLET | ORAL | 11 refills | Status: AC

## 2022-10-11 NOTE — Progress Notes (Unsigned)
Chief Complaint  Patient presents with   Pessary turned sideways, leaking urine now      67 y.o. G1P1001 No LMP recorded. Patient is postmenopausal. The current method of family planning is {contraception:315051}.  Outpatient Encounter Medications as of 10/11/2022  Medication Sig   ALPRAZolam (XANAX) 0.5 MG tablet Take 0.25 mg by mouth at bedtime as needed.    aspirin 81 MG chewable tablet Chew 1 tablet twice a day by oral route as directed for 30 days.   atorvastatin (LIPITOR) 20 MG tablet Take 20 mg by mouth daily.   Cholecalciferol (VITAMIN D3) 3000 UNITS TABS Take 1,000 Units by mouth. Take 1 tab daily   olmesartan-hydrochlorothiazide (BENICAR HCT) 40-12.5 MG tablet Take 1 tablet by mouth daily.   pantoprazole (PROTONIX) 40 MG tablet Take 40 mg by mouth daily.   potassium chloride SA (KLOR-CON) 20 MEQ tablet Take 1 tablet (20 mEq total) by mouth 2 (two) times daily.   lidocaine (LIDODERM) 5 % Place 1 patch onto the skin daily. Remove & Discard patch within 12 hours or as directed by MD (Patient not taking: Reported on 09/15/2021)   pravastatin (PRAVACHOL) 10 MG tablet Take by mouth. (Patient not taking: Reported on 10/11/2022)   solifenacin (VESICARE) 10 MG tablet Take 1 tablet at bedtime   No facility-administered encounter medications on file as of 10/11/2022.    Subjective *** Past Medical History:  Diagnosis Date   Arthritis    H/O hiatal hernia    Hypercholesterolemia    Hypertension     Past Surgical History:  Procedure Laterality Date   CARPAL TUNNEL RELEASE Bilateral    CHOLECYSTECTOMY N/A 05/26/2018   Procedure: LAPAROSCOPIC CHOLECYSTECTOMY;  Surgeon: Lucretia Roers, MD;  Location: [facility];  Service: General;  Laterality: N/A;   JOINT REPLACEMENT Right 03/2017   Right THA.   TOTAL HIP ARTHROPLASTY Left 08/18/2013   Procedure: LEFT TOTAL HIP ARTHROPLASTY ANTERIOR APPROACH;  Surgeon: Shelda Pal, MD;  Location: [facility];  Service: Orthopedics;   Laterality: Left;   TUBAL LIGATION      OB History     Gravida  1   Para  1   Term  1   Preterm      AB      Living  1      SAB      IAB      Ectopic      Multiple      Live Births  1           No Known Allergies  Social History   Socioeconomic History   Marital status: Divorced    Spouse name: Not on file   Number of children: Not on file   Years of education: Not on file   Highest education level: Not on file  Occupational History   Not on file  Tobacco Use   Smoking status: Never   Smokeless tobacco: Never  Vaping Use   Vaping status: Never Used  Substance and Sexual Activity   Alcohol use: No   Drug use: No   Sexual activity: Not Currently    Birth control/protection: Post-menopausal, Surgical    Comment: tubal  Other Topics Concern   Not on file  Social History Narrative   Not on file   Social Determinants of Health   Financial Resource Strain: Low Risk  (07/27/2022)   Overall Financial Resource Strain (CARDIA)    Difficulty of Paying Living Expenses: Not hard at  all  Food Insecurity: No Food Insecurity (07/27/2022)   Hunger Vital Sign    Worried About Running Out of Food in the Last Year: Never true    Ran Out of Food in the Last Year: Never true  Transportation Needs: No Transportation Needs (07/27/2022)   PRAPARE - Administrator, Civil Service (Medical): No    Lack of Transportation (Non-Medical): No  Physical Activity: Insufficiently Active (07/27/2022)   Exercise Vital Sign    Days of Exercise per Week: 3 days    Minutes of Exercise per Session: 20 min  Stress: No Stress Concern Present (07/27/2022)   [employer] of Occupational Health - Occupational Stress Questionnaire    Feeling of Stress : Not at all  Social Connections: Moderately Integrated (07/27/2022)   Social Connection and Isolation Panel [NHANES]    Frequency of Communication with Friends and Family: More than three times a week    Frequency of  Social Gatherings with Friends and Family: More than three times a week    Attends Religious Services: More than 4 times per year    Active Member of [employer] or Organizations: Yes    Attends Engineer, structural: More than 4 times per year    Marital Status: Divorced    Family History  Problem Relation Age of Onset   Heart attack Paternal Grandfather    Leukemia Paternal Grandmother    Heart attack Maternal Grandmother    Heart attack Maternal Grandfather    Heart attack Father    Heart attack Mother    Arthritis Mother    Irregular heart beat Mother     Medications:       Current Outpatient Medications:    ALPRAZolam (XANAX) 0.5 MG tablet, Take 0.25 mg by mouth at bedtime as needed. , Disp: , Rfl:    aspirin 81 MG chewable tablet, Chew 1 tablet twice a day by oral route as directed for 30 days., Disp: , Rfl:    atorvastatin (LIPITOR) 20 MG tablet, Take 20 mg by mouth daily., Disp: , Rfl:    Cholecalciferol (VITAMIN D3) 3000 UNITS TABS, Take 1,000 Units by mouth. Take 1 tab daily, Disp: , Rfl:    olmesartan-hydrochlorothiazide (BENICAR HCT) 40-12.5 MG tablet, Take 1 tablet by mouth daily., Disp: , Rfl:    pantoprazole (PROTONIX) 40 MG tablet, Take 40 mg by mouth daily., Disp: , Rfl:    potassium chloride SA (KLOR-CON) 20 MEQ tablet, Take 1 tablet (20 mEq total) by mouth 2 (two) times daily., Disp: 14 tablet, Rfl: 0   lidocaine (LIDODERM) 5 %, Place 1 patch onto the skin daily. Remove & Discard patch within 12 hours or as directed by MD (Patient not taking: Reported on 09/15/2021), Disp: 30 patch, Rfl: 0   pravastatin (PRAVACHOL) 10 MG tablet, Take by mouth. (Patient not taking: Reported on 10/11/2022), Disp: , Rfl:    solifenacin (VESICARE) 10 MG tablet, Take 1 tablet at bedtime, Disp: 30 tablet, Rfl: 11  Objective Blood pressure (!) 149/71, pulse 75, height 5\' 1"  (1.549 m), weight 213 lb (96.6 kg).  ***  Pertinent ROS ***  Labs or studies ***    Impression +  Management Plan: Diagnoses this Encounter::   ICD-10-CM   1. Pessary maintenance, Milex ring with support #4  Z46.89         Medications prescribed during  this encounter: Meds ordered this encounter  Medications   solifenacin (VESICARE) 10 MG tablet    Sig: Take 1  tablet at bedtime    Dispense:  30 tablet    Refill:  11    Labs or Scans Ordered during this encounter: No orders of the defined types were placed in this encounter.     Follow up No follow-ups on file.

## 2022-11-27 ENCOUNTER — Ambulatory Visit: Payer: 59 | Admitting: Obstetrics & Gynecology

## 2022-11-28 ENCOUNTER — Ambulatory Visit: Payer: 59 | Admitting: Obstetrics & Gynecology

## 2022-12-14 ENCOUNTER — Encounter: Payer: Self-pay | Admitting: Obstetrics & Gynecology

## 2022-12-14 ENCOUNTER — Ambulatory Visit (INDEPENDENT_AMBULATORY_CARE_PROVIDER_SITE_OTHER): Payer: 59 | Admitting: Obstetrics & Gynecology

## 2022-12-14 VITALS — BP 164/75 | HR 61 | Ht 61.0 in | Wt 213.0 lb

## 2022-12-14 DIAGNOSIS — Z4689 Encounter for fitting and adjustment of other specified devices: Secondary | ICD-10-CM | POA: Diagnosis not present

## 2022-12-14 DIAGNOSIS — R87610 Atypical squamous cells of undetermined significance on cytologic smear of cervix (ASC-US): Secondary | ICD-10-CM | POA: Diagnosis not present

## 2022-12-14 DIAGNOSIS — R87810 Cervical high risk human papillomavirus (HPV) DNA test positive: Secondary | ICD-10-CM

## 2022-12-14 NOTE — Progress Notes (Signed)
    Colposcopy Procedure Note:    Colposcopy Procedure Note  Indications:  2024 ASCUS +HR HPV    2019 ASCCP recommendation: colposcopy  Smoker:  No. New sexual partner:  No.    History of abnormal Pap: yes  Procedure Details  The risks and benefits of the procedure and Written informed consent obtained.  Speculum placed in vagina and excellent visualization of cervix achieved, cervix swabbed x 3 with acetic acid solution.  Findings: Adequate colposcopy is noted today.  Cervix: no visible lesions, no mosaicism, no punctation, and no abnormal vasculature; SCJ visualized 360 degrees without lesions and no biopsies taken. Vaginal inspection: normal without visible lesions. Vulvar colposcopy: vulvar colposcopy not performed.  Specimens: none  Complications: none.  Colposcopic Impression:   Plan(Based on 2019 ASCCP recommendations) Due to age no further cervical sampling is indicated  Chief Complaint  Patient presents with   Colposcopy    Blood pressure (!) 164/75, pulse 61, height 5\' 1"  (1.549 m), weight 213 lb (96.6 kg).  Sharon Chung presents today for routine follow up related to her pessary.   She uses a Milex ring with support #4 She reports no vaginal discharge and no vaginal bleeding   Likert scale(1 not bothersome -5 very bothersome)  :  1  Exam reveals no undue vaginal mucosal pressure of breakdown, no discharge and no vaginal bleeding.  Vaginal Epithelial Abnormality Classification System:   0 0    No abnormalities 1    Epithelial erythema 2    Granulation tissue 3    Epithelial break or erosion, 1 cm or less 4    Epithelial break or erosion, 1 cm or greater  The pessary is removed, cleaned and replaced without difficulty.      ICD-10-CM   1. ASCUS with positive high risk HPV cervical  R87.610    R87.810     2. Pessary maintenance, Milex ring with support #4  Z46.89        Sharon Chung will be sen back in 6 months for continued follow  up.  Sharon Arms, MD  12/14/2022 11:08 AM

## 2022-12-17 DIAGNOSIS — I700 Atherosclerosis of aorta: Secondary | ICD-10-CM | POA: Diagnosis not present

## 2022-12-17 DIAGNOSIS — M5459 Other low back pain: Secondary | ICD-10-CM | POA: Diagnosis not present

## 2022-12-17 DIAGNOSIS — N182 Chronic kidney disease, stage 2 (mild): Secondary | ICD-10-CM | POA: Diagnosis not present

## 2022-12-17 DIAGNOSIS — I10 Essential (primary) hypertension: Secondary | ICD-10-CM | POA: Diagnosis not present

## 2022-12-17 DIAGNOSIS — E7849 Other hyperlipidemia: Secondary | ICD-10-CM | POA: Diagnosis not present

## 2023-02-28 ENCOUNTER — Encounter: Payer: Self-pay | Admitting: Internal Medicine

## 2023-03-18 DIAGNOSIS — Z0000 Encounter for general adult medical examination without abnormal findings: Secondary | ICD-10-CM | POA: Diagnosis not present

## 2023-03-18 DIAGNOSIS — I10 Essential (primary) hypertension: Secondary | ICD-10-CM | POA: Diagnosis not present

## 2023-03-18 DIAGNOSIS — I700 Atherosclerosis of aorta: Secondary | ICD-10-CM | POA: Diagnosis not present

## 2023-03-18 DIAGNOSIS — N182 Chronic kidney disease, stage 2 (mild): Secondary | ICD-10-CM | POA: Diagnosis not present

## 2023-03-18 DIAGNOSIS — M5459 Other low back pain: Secondary | ICD-10-CM | POA: Diagnosis not present

## 2023-03-18 DIAGNOSIS — E7849 Other hyperlipidemia: Secondary | ICD-10-CM | POA: Diagnosis not present

## 2023-04-16 DIAGNOSIS — M65331 Trigger finger, right middle finger: Secondary | ICD-10-CM | POA: Diagnosis not present

## 2023-04-29 ENCOUNTER — Encounter: Payer: Self-pay | Admitting: Internal Medicine

## 2023-04-29 ENCOUNTER — Ambulatory Visit (INDEPENDENT_AMBULATORY_CARE_PROVIDER_SITE_OTHER): Payer: 59 | Admitting: Internal Medicine

## 2023-04-29 VITALS — BP 124/80 | HR 63 | Ht 61.0 in | Wt 198.0 lb

## 2023-04-29 DIAGNOSIS — Z9049 Acquired absence of other specified parts of digestive tract: Secondary | ICD-10-CM

## 2023-04-29 DIAGNOSIS — K219 Gastro-esophageal reflux disease without esophagitis: Secondary | ICD-10-CM | POA: Diagnosis not present

## 2023-04-29 NOTE — Progress Notes (Signed)
 HISTORY OF PRESENT ILLNESS:  Sharon Chung is a 68 y.o. 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  She is self-referred at the recommendation of her son.  Patient tells me that her prior GI care has been elsewhere in the [place] area.  She reports prior colonoscopies, with the last one 7 years ago.  She was told to follow-up in 10 years.  Her chief complaint today is that of heartburn, regurgitation, and bloating.  The symptoms have been going on for some time.  She states she underwent an upper endoscopy about 40 years ago.  Unsedated.  She was told she had a hiatal hernia.  About 1 year ago she was placed on pantoprazole  40 mg daily.  Symptoms were significantly improved though not completely resolved.  She does continue with breakthrough symptoms including regurgitation with pyrosis and water brash.  No dysphagia.  No weight loss.  Current BMI 37.4.  GI review of systems is otherwise negative.  Review of data (limited): - Ultrasound May 2020 revealed gallstones. - CT scan of the abdomen and pelvis with contrast May 2020 evaluate right upper quadrant pain revealed gallstones, diverticulosis, and small hiatal hernia.    She subsequently underwent laparoscopic cholecystectomy that same month (May 26, 2018).  REVIEW OF SYSTEMS:  All non-GI ROS negative unless otherwise stated in the HPI except for arthritis  Past Medical History:  Diagnosis Date   Arthritis    H/O hiatal hernia    Hypercholesterolemia    Hypertension     Past Surgical History:  Procedure Laterality Date   CARPAL TUNNEL RELEASE Bilateral    CHOLECYSTECTOMY N/A 05/26/2018   Procedure: LAPAROSCOPIC CHOLECYSTECTOMY;  Surgeon: Awilda Bogus, MD;  Location: [facility];  Service: General;  Laterality: N/A;   JOINT REPLACEMENT Right 03/2017   Right THA.   TOTAL HIP ARTHROPLASTY Left 08/18/2013   Procedure:  LEFT TOTAL HIP ARTHROPLASTY ANTERIOR APPROACH;  Surgeon: Bevin Bucks, MD;  Location: [facility];  Service: Orthopedics;  Laterality: Left;   TUBAL LIGATION      Social History Diamonds Lippard  reports that she has never smoked. She has never used smokeless tobacco. She reports that she does not drink alcohol and does not use drugs.  family history includes Arthritis in her mother; Heart attack in her father, maternal grandfather, maternal grandmother, mother, and paternal grandfather; Irregular heart beat in her mother; Leukemia in her paternal grandmother.  No Known Allergies     PHYSICAL EXAMINATION: Vital signs: BP 124/80   Pulse 63   Ht 5\' 1"  (1.549 m)   Wt 198 lb (89.8 kg)   BMI 37.41 kg/m   Constitutional: generally well-appearing, no acute distress Psychiatric: alert and oriented x3, cooperative Eyes: extraocular movements intact, anicteric, conjunctiva pink Mouth: oral pharynx moist, no lesions Neck: supple no lymphadenopathy Cardiovascular: heart regular rate and rhythm, no murmur Lungs: clear to auscultation bilaterally Abdomen: soft, nontender, nondistended, no obvious ascites, no peritoneal signs, normal bowel sounds, no organomegaly Rectal: Omitted Extremities: no clubbing, cyanosis, or lower extremity edema bilaterally Skin: no lesions on visible extremities Neuro: No focal deficits.  Cranial nerves intact  ASSESSMENT:  1.  GERD.  Significant symptoms despite daily PPI therapy (pantoprazole  40 mg daily).  Needs further evaluation. 2.  Status post cholecystectomy May 2020 3.  Colonoscopy screening provided elsewhere.  No lower GI complaints.  Not due for follow-up for  another 3 years, per patient report. 4.  General Medical problems.  Stable  PLAN:  1.  Reflux precautions.  Strict adherence 2.  Continue pantoprazole  40 mg daily 3.  Schedule upper endoscopy to further evaluate reflux symptoms despite PPI.The nature of the procedure, as well as the risks, benefits,  and alternatives were carefully and thoroughly reviewed with the patient. Ample time for discussion and questions allowed. The patient understood, was satisfied, and agreed to proceed. 4.  Patient may benefit from twice daily PPI therapy in addition to antireflux measures.  To be determined. 5.  This point, the patient will follow up in [facility] for her surveillance colonoscopy. A total time of 60 minutes was spent preparing to see the patient, reviewing records, obtaining comprehensive history, performing medically appropriate physical exam, counseling and educating the patient regarding the above listed issues, reviewing reflux precautions, providing literature, scheduling upper endoscopy, and documenting clinical information in the health record

## 2023-04-29 NOTE — Patient Instructions (Signed)
 You have been scheduled for an endoscopy and colonoscopy. Please follow the written instructions given to you at your visit today.  If you use inhalers (even only as needed), please bring them with you on the day of your procedure.  DO NOT TAKE 7 DAYS PRIOR TO TEST- Trulicity (dulaglutide) Ozempic, Wegovy (semaglutide) Mounjaro (tirzepatide) Bydureon Bcise (exanatide extended release)  DO NOT TAKE 1 DAY PRIOR TO YOUR TEST Rybelsus (semaglutide) Adlyxin (lixisenatide) Victoza (liraglutide) Byetta (exanatide) ___________________________________________________________________________  _______________________________________________________  If your blood pressure at your visit was 140/90 or greater, please contact your primary care physician to follow up on this.  _______________________________________________________  If you are age 25 or older, your body mass index should be between 23-30. Your Body mass index is 37.41 kg/m. If this is out of the aforementioned range listed, please consider follow up with your Primary Care Provider.  If you are age 44 or younger, your body mass index should be between 19-25. Your Body mass index is 37.41 kg/m. If this is out of the aformentioned range listed, please consider follow up with your Primary Care Provider.   ________________________________________________________  The [facility] providers would like to encourage you to use MYCHART to communicate with providers for non-urgent requests or questions.  Due to long hold times on the telephone, sending your provider a message by [facility] may be a faster and more efficient way to get a response.  Please allow 48 business hours for a response.  Please remember that this is for non-urgent requests.  _______________________________________________________

## 2023-05-16 DIAGNOSIS — M65331 Trigger finger, right middle finger: Secondary | ICD-10-CM | POA: Diagnosis not present

## 2023-06-11 ENCOUNTER — Encounter: Payer: Self-pay | Admitting: Internal Medicine

## 2023-06-11 ENCOUNTER — Ambulatory Visit (AMBULATORY_SURGERY_CENTER): Admitting: Internal Medicine

## 2023-06-11 VITALS — BP 113/48 | HR 62 | Temp 97.7°F | Resp 21 | Ht 61.0 in | Wt 198.0 lb

## 2023-06-11 DIAGNOSIS — Z9049 Acquired absence of other specified parts of digestive tract: Secondary | ICD-10-CM

## 2023-06-11 DIAGNOSIS — K219 Gastro-esophageal reflux disease without esophagitis: Secondary | ICD-10-CM | POA: Diagnosis not present

## 2023-06-11 MED ORDER — PANTOPRAZOLE SODIUM 40 MG PO TBEC: 40.0000 mg | DELAYED_RELEASE_TABLET | Freq: Two times a day (BID) | ORAL | 11 refills | Status: DC

## 2023-06-11 MED ORDER — SODIUM CHLORIDE 0.9 % IV SOLN: 500.0000 mL | Freq: Once | INTRAVENOUS | Status: DC

## 2023-06-11 NOTE — Progress Notes (Signed)
 Pt A/O x 3, gd SR's, pleased with anesthesia, report to RN

## 2023-06-11 NOTE — Op Note (Signed)
  [facility] Patient Name: Sharon Chung Procedure Date: 06/11/2023 10:43 AM MRN: [id number] Endoscopist: Murel Arlington. Elvin Hammer , MD, [id number] Age: 68 Referring MD:  Date of Birth: 1955/11/07 Gender: Female Account #: [id number] Procedure:                Upper GI endoscopy Indications:              Esophageal reflux. Refractory symptoms despite                            daily PPI Medicines:                Monitored Anesthesia Care Procedure:                Pre-Anesthesia Assessment:                           - Prior to the procedure, a History and Physical                            was performed, and patient medications and                            allergies were reviewed. The patient's tolerance of                            previous anesthesia was also reviewed. The risks                            and benefits of the procedure and the sedation                            options and risks were discussed with the patient.                            All questions were answered, and informed consent                            was obtained. Prior Anticoagulants: The patient has                            taken no anticoagulant or antiplatelet agents. ASA                            Grade Assessment: II - A patient with mild systemic                            disease. After reviewing the risks and benefits,                            the patient was deemed in satisfactory condition to                            undergo the procedure.  After obtaining informed consent, the endoscope was                            passed under direct vision. Throughout the                            procedure, the patient's blood pressure, pulse, and                            oxygen saturations were monitored continuously. The                            GIF [id number] #[id number] was introduced through the                            mouth, and advanced to the second part of duodenum.                             The upper GI endoscopy was accomplished without                            difficulty. The patient tolerated the procedure                            well. Scope In: Scope Out: Findings:                 The esophagus revealed erosive esophagitis in the                            distal portion. No Barrett's. No obvious stricture.                           The stomach was normal save benign fundic gland                            polyps. Small hiatal hernia.                           The examined duodenum was normal.                           The cardia and gastric fundus were normal on                            retroflexion. Complications:            No immediate complications. Estimated Blood Loss:     Estimated blood loss: none. Impression:               1. GERD with erosive esophagitis                           2. Small hiatal hernia and benign fundic gland  polyps                           3. Otherwise normal exam. Recommendation:           - Patient has a contact number available for                            emergencies. The signs and symptoms of potential                            delayed complications were discussed with the                            patient. Return to normal activities tomorrow.                            Written discharge instructions were provided to the                            patient.                           - Resume previous diet.                           - Continue present medications.                           - Reflux precautions with attention to weight loss                           - PRESCRIBED pantoprazole  40 mg p.o. twice daily;                            #60; 11 refills. Take this medicine twice daily                            until your follow-up                           - Office follow up with Dr. Elvin Hammer in 6 to 8 weeks Murel Arlington. Elvin Hammer, MD 06/11/2023 11:16:27 AM This report has been  signed electronically.

## 2023-06-11 NOTE — Patient Instructions (Addendum)
-  Handout on GERD and esophagitis provided. -Continue present medications -Take Pantoprazole  40 mg twice a day until your follow up appointment   YOU HAD AN ENDOSCOPIC PROCEDURE TODAY AT THE [facility]:   Refer to the procedure report that was given to you for any specific questions about what was found during the examination.  If the procedure report does not answer your questions, please call your gastroenterologist to clarify.  If you requested that your care partner not be given the details of your procedure findings, then the procedure report has been included in a sealed envelope for you to review at your convenience later.  YOU SHOULD EXPECT: Some feelings of bloating in the abdomen. Passage of more gas than usual.  Walking can help get rid of the air that was put into your GI tract during the procedure and reduce the bloating. If you had a lower endoscopy (such as a colonoscopy or flexible sigmoidoscopy) you may notice spotting of blood in your stool or on the toilet paper. If you underwent a bowel prep for your procedure, you may not have a normal bowel movement for a few days.  Please Note:  You might notice some irritation and congestion in your nose or some drainage.  This is from the oxygen used during your procedure.  There is no need for concern and it should clear up in a day or so.  SYMPTOMS TO REPORT IMMEDIATELY:  Following upper endoscopy (EGD)  Vomiting of blood or coffee ground material  New chest pain or pain under the shoulder blades  Painful or persistently difficult swallowing  New shortness of breath  Fever of 100F or higher  Black, tarry-looking stools  For urgent or emergent issues, a gastroenterologist can be reached at any hour by calling [phone]. Do not use MyChart messaging for urgent concerns.    DIET:  We do recommend a small meal at first, but then you may proceed to your regular diet.  Drink plenty of fluids but you should avoid  alcoholic beverages for 24 hours.  ACTIVITY:  You should plan to take it easy for the rest of today and you should NOT DRIVE or use heavy machinery until tomorrow (because of the sedation medicines used during the test).    FOLLOW UP: Our staff will call the number listed on your records the next business day following your procedure.  We will call around 7:15- 8:00 am to check on you and address any questions or concerns that you may have regarding the information given to you following your procedure. If we do not reach you, we will leave a message.     If any biopsies were taken you will be contacted by phone or by letter within the next 1-3 weeks.  Please call us  at [phone] if you have not heard about the biopsies in 3 weeks.    SIGNATURES/CONFIDENTIALITY: You and/or your care partner have signed paperwork which will be entered into your electronic medical record.  These signatures attest to the fact that that the information above on your After Visit Summary has been reviewed and is understood.  Full responsibility of the confidentiality of this discharge information lies with you and/or your care-partner.

## 2023-06-11 NOTE — Progress Notes (Signed)
 Expand All Collapse All HISTORY OF PRESENT ILLNESS:   Analyah Chung is a 68 y.o. 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  She is self-referred at the recommendation of her son.   Patient tells me that her prior GI care has been elsewhere in the [place] area.  She reports prior colonoscopies, with the last one 7 years ago.  She was told to follow-up in 10 years.   Her chief complaint today is that of heartburn, regurgitation, and bloating.  The symptoms have been going on for some time.  She states she underwent an upper endoscopy about 40 years ago.  Unsedated.  She was told she had a hiatal hernia.   About 1 year ago she was placed on pantoprazole  40 mg daily.  Symptoms were significantly improved though not completely resolved.  She does continue with breakthrough symptoms including regurgitation with pyrosis and water brash.  No dysphagia.  No weight loss.  Current BMI 37.4.  GI review of systems is otherwise negative.   Review of data (limited): - Ultrasound May 2020 revealed gallstones. - CT scan of the abdomen and pelvis with contrast May 2020 evaluate right upper quadrant pain revealed gallstones, diverticulosis, and small hiatal hernia.     She subsequently underwent laparoscopic cholecystectomy that same month (May 26, 2018).   REVIEW OF SYSTEMS:   All non-GI ROS negative unless otherwise stated in the HPI except for arthritis       Past Medical History:  Diagnosis Date   Arthritis     H/O hiatal hernia     Hypercholesterolemia     Hypertension                 Past Surgical History:  Procedure Laterality Date   CARPAL TUNNEL RELEASE Bilateral     CHOLECYSTECTOMY N/A 05/26/2018    Procedure: LAPAROSCOPIC CHOLECYSTECTOMY;  Surgeon: Awilda Bogus, MD;  Location: [facility];  Service: General;  Laterality: N/A;   JOINT REPLACEMENT Right 03/2017    Right  THA.   TOTAL HIP ARTHROPLASTY Left 08/18/2013    Procedure: LEFT TOTAL HIP ARTHROPLASTY ANTERIOR APPROACH;  Surgeon: Bevin Bucks, MD;  Location: [facility];  Service: Orthopedics;  Laterality: Left;   TUBAL LIGATION              Social History Arena Lindahl  reports that she has never smoked. She has never used smokeless tobacco. She reports that she does not drink alcohol and does not use drugs.   family history includes Arthritis in her mother; Heart attack in her father, maternal grandfather, maternal grandmother, mother, and paternal grandfather; Irregular heart beat in her mother; Leukemia in her paternal grandmother.   Allergies  No Known Allergies         PHYSICAL EXAMINATION: Vital signs: BP 124/80   Pulse 63   Ht 5\' 1"  (1.549 m)   Wt 198 lb (89.8 kg)   BMI 37.41 kg/m   Constitutional: generally well-appearing, no acute distress Psychiatric: alert and oriented x3, cooperative Eyes: extraocular movements intact, anicteric, conjunctiva pink Mouth: oral pharynx moist, no lesions Neck: supple no lymphadenopathy Cardiovascular: heart regular rate and rhythm, no murmur Lungs: clear to auscultation bilaterally Abdomen: soft, nontender, nondistended, no obvious ascites, no peritoneal signs, normal bowel sounds, no organomegaly Rectal: Omitted Extremities: no clubbing, cyanosis, or lower extremity edema bilaterally Skin: no lesions on visible extremities Neuro:  No focal deficits.  Cranial nerves intact   ASSESSMENT:   1.  GERD.  Significant symptoms despite daily PPI therapy (pantoprazole  40 mg daily).  Needs further evaluation. 2.  Status post cholecystectomy May 2020 3.  Colonoscopy screening provided elsewhere.  No lower GI complaints.  Not due for follow-up for another 3 years, per patient report. 4.  General Medical problems.  Stable   PLAN:   1.  Reflux precautions.  Strict adherence 2.  Continue pantoprazole  40 mg daily 3.  Schedule upper endoscopy to further  evaluate reflux symptoms despite PPI.The nature of the procedure, as well as the risks, benefits, and alternatives were carefully and thoroughly reviewed with the patient. Ample time for discussion and questions allowed. The patient understood, was satisfied, and agreed to proceed. 4.  Patient may benefit from twice daily PPI therapy in addition to antireflux measures.  To be determined. 5.  This point, the patient will follow up in [facility] for her surveillance colonoscopy. A total time of 60 minutes was spent preparing to see the patient, reviewing records, obtaining comprehensive history, performing medically appropriate physical exam, counseling and educating the patient regarding the above listed issues, reviewing reflux precautions, providing literature, scheduling upper endoscopy, and documenting clinical information in the health record              Patient Instructions by Vernard Goldberg, CMA at 04/29/2023 10:40 AM  Author: Vernard Goldberg, CMA Author

## 2023-06-12 ENCOUNTER — Telehealth: Payer: Self-pay | Admitting: *Deleted

## 2023-06-12 NOTE — Telephone Encounter (Signed)
 Post procedure follow up call placed, no answer and left VM.

## 2023-07-15 ENCOUNTER — Encounter: Payer: Self-pay | Admitting: Obstetrics & Gynecology

## 2023-07-15 ENCOUNTER — Ambulatory Visit: Admitting: Obstetrics & Gynecology

## 2023-07-15 VITALS — BP 135/86 | Ht 61.0 in | Wt 191.0 lb

## 2023-07-15 DIAGNOSIS — Z4689 Encounter for fitting and adjustment of other specified devices: Secondary | ICD-10-CM | POA: Diagnosis not present

## 2023-07-15 DIAGNOSIS — N3281 Overactive bladder: Secondary | ICD-10-CM

## 2023-07-15 NOTE — Progress Notes (Signed)
 Chief Complaint  Patient presents with   Pessary Check    Blood pressure 135/86, height 5' 1 (1.549 m), weight 191 lb (86.6 kg).  Sharon Chung presents today for routine follow up related to her pessary.   She uses a Milex ring with support #4 She reports no vaginal discharge and no vaginal bleeding   Likert scale(1 not bothersome -5 very bothersome)  :  1  Exam reveals no undue vaginal mucosal pressure of breakdown, no discharge and no vaginal bleeding.  Vaginal Epithelial Abnormality Classification System:   0 0    No abnormalities 1    Epithelial erythema 2    Granulation tissue 3    Epithelial break or erosion, 1 cm or less 4    Epithelial break or erosion, 1 cm or greater  The pessary is removed, cleaned and replaced without difficulty.      ICD-10-CM   1. Pessary maintenance, Milex ring with support #4: placed 2019  Z46.89     2. Detrusor instability of bladder  N32.81        Sharon Chung will be sen back in 6 months for continued follow up.  Vonn VEAR Inch, MD  07/15/2023 9:12 AM

## 2023-08-01 DIAGNOSIS — Z0000 Encounter for general adult medical examination without abnormal findings: Secondary | ICD-10-CM | POA: Diagnosis not present

## 2023-08-01 DIAGNOSIS — N182 Chronic kidney disease, stage 2 (mild): Secondary | ICD-10-CM | POA: Diagnosis not present

## 2023-08-01 DIAGNOSIS — I10 Essential (primary) hypertension: Secondary | ICD-10-CM | POA: Diagnosis not present

## 2023-08-01 DIAGNOSIS — M5459 Other low back pain: Secondary | ICD-10-CM | POA: Diagnosis not present

## 2023-08-01 DIAGNOSIS — I700 Atherosclerosis of aorta: Secondary | ICD-10-CM | POA: Diagnosis not present

## 2023-08-01 DIAGNOSIS — E7849 Other hyperlipidemia: Secondary | ICD-10-CM | POA: Diagnosis not present

## 2023-08-30 ENCOUNTER — Encounter: Payer: Self-pay | Admitting: Internal Medicine

## 2023-08-30 ENCOUNTER — Ambulatory Visit: Admitting: Internal Medicine

## 2023-08-30 VITALS — BP 118/70 | HR 64 | Ht 61.0 in | Wt 198.1 lb

## 2023-08-30 DIAGNOSIS — E669 Obesity, unspecified: Secondary | ICD-10-CM

## 2023-08-30 DIAGNOSIS — K219 Gastro-esophageal reflux disease without esophagitis: Secondary | ICD-10-CM

## 2023-08-30 DIAGNOSIS — Z9049 Acquired absence of other specified parts of digestive tract: Secondary | ICD-10-CM | POA: Diagnosis not present

## 2023-08-30 MED ORDER — PANTOPRAZOLE SODIUM 40 MG PO TBEC: 40.0000 mg | DELAYED_RELEASE_TABLET | Freq: Two times a day (BID) | ORAL | 3 refills | Status: AC

## 2023-08-30 NOTE — Patient Instructions (Addendum)
 We have sent the following medications to your pharmacy for you to pick up at your convenience:  Protonix .  Please follow up in one year.  _______________________________________________________  If your blood pressure at your visit was 140/90 or greater, please contact your primary care physician to follow up on this.  _______________________________________________________  If you are age 70 or older, your body mass index should be between 23-30. Your Body mass index is 37.44 kg/m. If this is out of the aforementioned range listed, please consider follow up with your Primary Care Provider.  If you are age 60 or younger, your body mass index should be between 19-25. Your Body mass index is 37.44 kg/m. If this is out of the aformentioned range listed, please consider follow up with your Primary Care Provider.   ________________________________________________________  The [facility] providers would like to encourage you to use MYCHART to communicate with providers for non-urgent requests or questions.  Due to long hold times on the telephone, sending your provider a message by [facility] may be a faster and more efficient way to get a response.  Please allow 48 business hours for a response.  Please remember that this is for non-urgent requests.  _______________________________________________________  [facility] is using a team-based approach to care.  Your team is made up of your doctor and two to three APPS. Our APPS (Nurse Practitioners and Physician Assistants) work with your physician to ensure care continuity for you. They are fully qualified to address your health concerns and develop a treatment plan. They communicate directly with your gastroenterologist to care for you. Seeing the Advanced Practice Practitioners on your physician's team can help you by facilitating care more promptly, often allowing for earlier appointments, access to diagnostic testing, procedures, and  other specialty referrals.

## 2023-08-30 NOTE — Progress Notes (Signed)
 HISTORY OF PRESENT ILLNESS:  Sharon Chung is a 68 y.o. 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  See that dictation for details.  At that time she was advised with regards to reflux precautions, continued on pantoprazole  40 mg daily, and scheduled for upper endoscopy.  Upper endoscopy was performed June 11, 2023.  She was found to have erosive esophagitis.  Her pantoprazole  was increased to 40 mg twice daily.  Follow-up at this time recommended.  Patient tells me that she is doing extremely well.  She has adjusted her diet and been compliant with twice daily PPI therapy.  She has absolutely no reflux and is pleased.  No medication side effects.  She does wonder about having her follow-up colonoscopy here, should she need to have colonoscopy.    REVIEW OF SYSTEMS:  All non-GI ROS negative. Past Medical History:  Diagnosis Date   Arthritis    GERD (gastroesophageal reflux disease)    H/O hiatal hernia    Hypercholesterolemia    Hypertension     Past Surgical History:  Procedure Laterality Date   CARPAL TUNNEL RELEASE Bilateral    CHOLECYSTECTOMY N/A 05/26/2018   Procedure: LAPAROSCOPIC CHOLECYSTECTOMY;  Surgeon: Kallie Manuelita BROCKS, MD;  Location: [facility];  Service: General;  Laterality: N/A;   JOINT REPLACEMENT Right 03/2017   Right THA.   TOTAL HIP ARTHROPLASTY Left 08/18/2013   Procedure: LEFT TOTAL HIP ARTHROPLASTY ANTERIOR APPROACH;  Surgeon: Donnice JONETTA Car, MD;  Location: [facility];  Service: Orthopedics;  Laterality: Left;   TUBAL LIGATION      Social History Keyri Salberg  reports that she has never smoked. She has never used smokeless tobacco. She reports that she does not drink alcohol and does not use drugs.  family history includes Arthritis in her mother; Heart attack in her father, maternal grandfather, maternal grandmother, mother, and paternal  grandfather; Irregular heart beat in her mother; Leukemia in her paternal grandmother.  No Known Allergies     PHYSICAL EXAMINATION: Vital signs: BP 118/70 (BP Location: Left Arm, Patient Position: Sitting, Cuff Size: Large)   Pulse 64   Ht 5' 1 (1.549 m)   Wt 198 lb 2 oz (89.9 kg)   BMI 37.44 kg/m  General: Well-developed, well-nourished, no acute distress HEENT: Sclerae are anicteric, conjunctiva pink. Oral mucosa intact Lungs: Clear Heart: Regular Abdomen: soft, nontender, nondistended, no obvious ascites, no peritoneal signs, normal bowel sounds. No organomegaly. Extremities: No edema Psychiatric: alert and oriented x3. Cooperative     ASSESSMENT:   1.  GERD.  Significant symptoms despite daily PPI therapy (pantoprazole  40 mg daily).  Upper endoscopy revealed esophagitis.  PPI increased to pantoprazole  40 mg twice daily.  Currently asymptomatic. 2.  Status post cholecystectomy May 2020 3.  Colonoscopy screening provided elsewhere.  No lower GI complaints.  Not due for follow-up for another 3 years, per patient report.  Asking if she can pursue colonoscopy here in the future.  I asked her to obtain her outside records, if possible, for my review. 4.  Obesity 5.  General Medical problems.  Stable   PLAN:   1.  Reflux precautions.  Strict adherence.  Weight loss 2.  Continue pantoprazole  40 mg twice daily.  Medication refilled.  Medication risks reviewed. 3.  Routine office follow-up 1 year.  Contact the office in the interim for any questions or problems.

## 2023-09-18 DIAGNOSIS — Z780 Asymptomatic menopausal state: Secondary | ICD-10-CM | POA: Diagnosis not present

## 2023-09-18 DIAGNOSIS — M810 Age-related osteoporosis without current pathological fracture: Secondary | ICD-10-CM | POA: Diagnosis not present

## 2023-10-04 DIAGNOSIS — N182 Chronic kidney disease, stage 2 (mild): Secondary | ICD-10-CM | POA: Diagnosis not present

## 2023-10-04 DIAGNOSIS — I10 Essential (primary) hypertension: Secondary | ICD-10-CM | POA: Diagnosis not present

## 2023-10-31 DIAGNOSIS — I10 Essential (primary) hypertension: Secondary | ICD-10-CM | POA: Diagnosis not present

## 2023-10-31 DIAGNOSIS — I700 Atherosclerosis of aorta: Secondary | ICD-10-CM | POA: Diagnosis not present

## 2023-10-31 DIAGNOSIS — E7849 Other hyperlipidemia: Secondary | ICD-10-CM | POA: Diagnosis not present

## 2023-10-31 DIAGNOSIS — N182 Chronic kidney disease, stage 2 (mild): Secondary | ICD-10-CM | POA: Diagnosis not present

## 2023-10-31 DIAGNOSIS — M5459 Other low back pain: Secondary | ICD-10-CM | POA: Diagnosis not present

## 2023-10-31 DIAGNOSIS — M25741 Osteophyte, right hand: Secondary | ICD-10-CM | POA: Diagnosis not present

## 2023-12-20 ENCOUNTER — Ambulatory Visit: Admitting: Obstetrics & Gynecology

## 2023-12-20 ENCOUNTER — Encounter: Payer: Self-pay | Admitting: Obstetrics & Gynecology

## 2023-12-20 VITALS — BP 160/90 | HR 84 | Ht 61.0 in | Wt 192.0 lb

## 2023-12-20 DIAGNOSIS — N3281 Overactive bladder: Secondary | ICD-10-CM

## 2023-12-20 DIAGNOSIS — Z4689 Encounter for fitting and adjustment of other specified devices: Secondary | ICD-10-CM

## 2023-12-20 NOTE — Progress Notes (Signed)
 Chief Complaint  Patient presents with   Pessary Check    Blood pressure (!) 160/90, pulse 84, height 5' 1 (1.549 m), weight 192 lb (87.1 kg).  Sharon Chung presents today for routine follow up related to her pessary.   She uses a Milex ring with support #4 She reports no vaginal discharge and no vaginal bleeding   Likert scale(1 not bothersome -5 very bothersome)  :  1  Exam reveals no undue vaginal mucosal pressure of breakdown, no discharge and no vaginal bleeding.  Vaginal Epithelial Abnormality Classification System:   0 0    No abnormalities 1    Epithelial erythema 2    Granulation tissue 3    Epithelial break or erosion, 1 cm or less 4    Epithelial break or erosion, 1 cm or greater  The pessary is removed, cleaned and replaced without difficulty.      ICD-10-CM   1. Pessary maintenance, Milex ring with support #4: placed 2019  Z46.89     2. Detrusor instability of bladder  N32.81        Sharon Chung will be sen back in 4 months for continued follow up.  Vonn VEAR Inch, MD  12/20/2023 9:20 AM
